# Patient Record
Sex: Female | Born: 1950 | Race: White | Hispanic: No | Marital: Married | Smoking: Never smoker
Health system: Southern US, Community
[De-identification: ages and names within clinical notes are randomized; demographics above are authoritative.]

## PROBLEM LIST (undated history)

## (undated) DIAGNOSIS — K219 Gastro-esophageal reflux disease without esophagitis: Secondary | ICD-10-CM

## (undated) DIAGNOSIS — E785 Hyperlipidemia, unspecified: Secondary | ICD-10-CM

## (undated) DIAGNOSIS — R7303 Prediabetes: Secondary | ICD-10-CM

## (undated) HISTORY — PX: COLONOSCOPY: SHX174

## (undated) HISTORY — DX: Prediabetes: R73.03

## (undated) HISTORY — DX: Hyperlipidemia, unspecified: E78.5

---

## 1978-11-27 HISTORY — PX: TUBAL LIGATION: SHX77

## 1998-04-16 ENCOUNTER — Other Ambulatory Visit: Admission: RE | Admit: 1998-04-16 | Discharge: 1998-04-16 | Payer: Self-pay | Admitting: Obstetrics and Gynecology

## 1999-12-30 ENCOUNTER — Other Ambulatory Visit: Admission: RE | Admit: 1999-12-30 | Discharge: 1999-12-30 | Payer: Self-pay | Admitting: Obstetrics and Gynecology

## 2000-03-06 ENCOUNTER — Ambulatory Visit (HOSPITAL_COMMUNITY): Admission: RE | Admit: 2000-03-06 | Discharge: 2000-03-06 | Payer: Self-pay | Admitting: Internal Medicine

## 2001-01-16 ENCOUNTER — Other Ambulatory Visit: Admission: RE | Admit: 2001-01-16 | Discharge: 2001-01-16 | Payer: Self-pay | Admitting: Obstetrics and Gynecology

## 2002-03-26 ENCOUNTER — Other Ambulatory Visit: Admission: RE | Admit: 2002-03-26 | Discharge: 2002-03-26 | Payer: Self-pay | Admitting: Obstetrics and Gynecology

## 2003-03-27 ENCOUNTER — Other Ambulatory Visit: Admission: RE | Admit: 2003-03-27 | Discharge: 2003-03-27 | Payer: Self-pay | Admitting: Obstetrics and Gynecology

## 2003-08-13 ENCOUNTER — Encounter: Admission: RE | Admit: 2003-08-13 | Discharge: 2003-08-13 | Payer: Self-pay | Admitting: Surgery

## 2003-08-13 ENCOUNTER — Encounter: Payer: Self-pay | Admitting: Surgery

## 2003-08-14 ENCOUNTER — Ambulatory Visit (HOSPITAL_BASED_OUTPATIENT_CLINIC_OR_DEPARTMENT_OTHER): Admission: RE | Admit: 2003-08-14 | Discharge: 2003-08-14 | Payer: Self-pay | Admitting: Surgery

## 2003-11-28 HISTORY — PX: HERNIA REPAIR: SHX51

## 2004-11-09 ENCOUNTER — Other Ambulatory Visit: Admission: RE | Admit: 2004-11-09 | Discharge: 2004-11-09 | Payer: Self-pay | Admitting: Obstetrics and Gynecology

## 2004-12-09 ENCOUNTER — Encounter: Admission: RE | Admit: 2004-12-09 | Discharge: 2004-12-09 | Payer: Self-pay | Admitting: Obstetrics and Gynecology

## 2006-01-25 ENCOUNTER — Other Ambulatory Visit: Admission: RE | Admit: 2006-01-25 | Discharge: 2006-01-25 | Payer: Self-pay | Admitting: Obstetrics and Gynecology

## 2012-05-02 ENCOUNTER — Encounter (INDEPENDENT_AMBULATORY_CARE_PROVIDER_SITE_OTHER): Payer: Self-pay | Admitting: General Surgery

## 2012-05-06 ENCOUNTER — Telehealth (INDEPENDENT_AMBULATORY_CARE_PROVIDER_SITE_OTHER): Payer: Self-pay

## 2012-05-06 NOTE — Telephone Encounter (Signed)
Notified the patient that I did not have an appt before 6/20, she understood and we discussed if her symptoms become more severe and she starts a fever, with nausea and vomiting she needs to go to the ED

## 2012-05-06 NOTE — Telephone Encounter (Signed)
The patient called regarding her appointment on 6/20.  She has gallstones and a constant ache.  She has no fever and she is not vomiting.  She would like to be seen this week if she can.  Please call her cell 1st [phone] and then her home #.

## 2012-05-16 ENCOUNTER — Ambulatory Visit (INDEPENDENT_AMBULATORY_CARE_PROVIDER_SITE_OTHER): Payer: BC Managed Care – PPO | Admitting: Surgery

## 2012-05-16 ENCOUNTER — Encounter (INDEPENDENT_AMBULATORY_CARE_PROVIDER_SITE_OTHER): Payer: Self-pay | Admitting: Surgery

## 2012-05-16 VITALS — BP 126/78 | HR 72 | Temp 98.0°F | Resp 18 | Ht 66.0 in | Wt 156.5 lb

## 2012-05-16 DIAGNOSIS — K8020 Calculus of gallbladder without cholecystitis without obstruction: Secondary | ICD-10-CM | POA: Insufficient documentation

## 2012-05-16 NOTE — Progress Notes (Signed)
Chief Complaint:  Abdominal and back pain and gallstones  History of Present Illness:  Catherine Stokes is an 61 y.o. female on M. I did aumbilical hernia several years ago his referred by Dr. Zella Ball gallstones. Ultrasound showed multiple gallstones the largest of which is 1.5cm in diameter. She's been having pain its worse after eating. She came to the office and I discussed laparoscopic cholecystectomy with her in some detail including complications not li bile leak bleedijury or common duct injury. She was to her and proceed with scheduling.  In the last several months she has hada pelvic exam by Dr. Tenny Craw, and a colonoscopy.  No past medical history on file.  No past surgical history on file.  Current Outpatient Prescriptions  Medication Sig Dispense Refill  . famotidine (PEPCID) 20 MG tablet Take 20 mg by mouth 2 (two) times daily.       Review of patient's allergies indicates no known allergies. Family History  Problem Relation Age of Onset  . Diabetes Mother   . Heart disease Mother    Social History:   reports that she has never smoked. She does not have any smokeless tobacco history on file. She reports that she does not drink alcohol or use illicit drugs.   REVIEW OF SYSTEMS - PERTINENT POSITIVES ONLY: noncontributory  Physical Exam:   Blood pressure 126/78, pulse 72, temperature 98 F (36.7 C), temperature source Temporal, resp. rate 18, height 5\' 6"  (1.676 m), weight 156 lb 8 oz (70.988 kg). Body mass index is 25.26 kg/(m^2).  Gen:  WDWN hite female NAD  Neurological: Alert and oriented to person, place, and time. Motor and sensory function is grossly intact  Head: Normocephalic and atraumatic.  Eyes: Conjunctivae are normal. Pupils are equal, round, and reactive to light. No scleral icterus.  Neck: Normal range of motion. Neck supple. No tracheal deviation or thyromegaly present.  Cardiovascular:  SR without murmurs or gallops.  No carotid  bruits Respiratory: Effort normal.  No respiratory distress. No chest wall tenderness. Breath sounds normal.  No wheezes, rales or rhonchi.  Abdomen:  nontender GU: Musculoskeletal: Normal range of motion. Extremities are nontender. No cyanosis, edema or clubbing noted Lymphadenopathy: No cervical, preauricular, postauricular or axillary adenopathy is present Skin: Skin is warm and dry. No rash noted. No diaphoresis. No erythema. No pallor. Pscyh: Normal mood and affect. Behavior is normal. Judgment and thought content normal.   LABORATORY RESULTS: No results found for this or any previous visit (from the past 48 hour(s)).  RADIOLOGY RESULTS: No results found.  Problem List: There is no problem list on file for this patient.   Assessment & Plan: Gallstones that are likely symptomatic.lan laparoscopic cholecystectomy with intraoperative cholangiogram.    Matt B. Daphine Deutscher, MD, [facility] [phone] beeper [phone]  05/16/2012 11:23 AM

## 2012-05-16 NOTE — Patient Instructions (Addendum)
Thanks for your patience.  If you need further assistance after leaving the office, please call our office and speak with Tracy A.  [phone].  If you want to leave a message for Dr. Dossie Ocanas, please call his office phone at [phone]. 

## 2012-05-22 ENCOUNTER — Encounter (INDEPENDENT_AMBULATORY_CARE_PROVIDER_SITE_OTHER): Payer: Self-pay

## 2012-07-12 ENCOUNTER — Encounter (HOSPITAL_COMMUNITY): Payer: Self-pay

## 2012-07-16 NOTE — Progress Notes (Signed)
Dr. Daphine Deutscher : Catherine Stokes  Coming for preop Wed 07/17/12 when you can, we need orders put in EPIC please. Thank you -

## 2012-07-17 ENCOUNTER — Encounter (HOSPITAL_COMMUNITY): Payer: Self-pay

## 2012-07-17 ENCOUNTER — Encounter (HOSPITAL_COMMUNITY)
Admission: RE | Admit: 2012-07-17 | Discharge: 2012-07-17 | Disposition: A | Discharge status: Home / Self Care | Payer: BC Managed Care – PPO | Source: Ambulatory Visit | Attending: Surgery | Admitting: Surgery

## 2012-07-17 ENCOUNTER — Other Ambulatory Visit (INDEPENDENT_AMBULATORY_CARE_PROVIDER_SITE_OTHER): Payer: Self-pay | Admitting: Surgery

## 2012-07-17 HISTORY — DX: Gastro-esophageal reflux disease without esophagitis: K21.9

## 2012-07-17 LAB — SURGICAL PCR SCREEN: Staphylococcus aureus: NEGATIVE

## 2012-07-17 LAB — CBC
HCT: 35.9 % — ABNORMAL LOW (ref 36.0–46.0)
Hemoglobin: 12.2 g/dL (ref 12.0–15.0)
MCHC: 34 g/dL (ref 30.0–36.0)
MCV: 88.6 fL (ref 78.0–100.0)
WBC: 9 10*3/uL (ref 4.0–10.5)

## 2012-07-17 NOTE — Patient Instructions (Addendum)
20 Catherine Stokes  07/17/2012   Your procedure is scheduled on:  Wednesday 07/24/2012 at 0830am  Report to [facility] at 0600 AM.  Call this number if you have problems the morning of surgery: [phone]   Remember:   Do not eat food:After Midnight.  May have clear liquids:until Midnight .  Marland Kitchen  Take these medicines the morning of surgery with A SIP OF WATER: Pepcid, Prilosec   Do not wear jewelry, make-up or nail polish.  Do not wear lotions, powders, or perfumes.   Do not shave 48 hours prior to surgery. Men may shave face and neck.  Do not bring valuables to the hospital.  Contacts, dentures or bridgework may not be worn into surgery.  Leave suitcase in the car. After surgery it may be brought to your room.  For patients admitted to the hospital, checkout time is 11:00 AM the day of discharge.      Special Instructions: CHG Shower Use Special Wash: 1/2 bottle night before surgery and 1/2 bottle morning of surgery.   Please read over the following fact sheets that you were given: MRSA Information, Incentive Spirometry, Sleep Apnea sheet                 If any questions, please call me at [phone] [name].[name], Charity fundraiser, BSN

## 2012-07-23 NOTE — H&P (Addendum)
Chief Complaint: Abdominal and back pain and gallstones  History of Present Illness: Catherine Stokes is an 61 y.o. female on M. I did aumbilical hernia several years ago his referred by Dr. Zella Ball gallstones. Ultrasound showed multiple gallstones the largest of which is 1.5cm in diameter. She's been having pain its worse after eating. She came to the office and I discussed laparoscopic cholecystectomy with her in some detail including complications not li bile leak bleedijury or common duct injury. She was to her and proceed with scheduling.  In the last several months she has hada pelvic exam by Dr. Tenny Craw, and a colonoscopy.  No past medical history on file.  No past surgical history on file.  Current Outpatient Prescriptions   Medication  Sig  Dispense  Refill   .  famotidine (PEPCID) 20 MG tablet  Take 20 mg by mouth 2 (two) times daily.      Review of patient's allergies indicates no known allergies.  Family History   Problem  Relation  Age of Onset   .  Diabetes  Mother    .  Heart disease  Mother     Social History: reports that she has never smoked. She does not have any smokeless tobacco history on file. She reports that she does not drink alcohol or use illicit drugs.  REVIEW OF SYSTEMS - PERTINENT POSITIVES ONLY:  noncontributory  Physical Exam:  Blood pressure 126/78, pulse 72, temperature 98 F (36.7 C), temperature source Temporal, resp. rate 18, height 5\' 6"  (1.676 m), weight 156 lb 8 oz (70.988 kg).  Body mass index is 25.26 kg/(m^2).  Gen: WDWN hite female NAD  Neurological: Alert and oriented to person, place, and time. Motor and sensory function is grossly intact  Head: Normocephalic and atraumatic.  Eyes: Conjunctivae are normal. Pupils are equal, round, and reactive to light. No scleral icterus.  Neck: Normal range of motion. Neck supple. No tracheal deviation or thyromegaly present.  Cardiovascular: SR without murmurs or gallops. No carotid bruits    Respiratory: Effort normal. No respiratory distress. No chest wall tenderness. Breath sounds normal. No wheezes, rales or rhonchi.  Abdomen: nontender  GU:  Musculoskeletal: Normal range of motion. Extremities are nontender. No cyanosis, edema or clubbing noted Lymphadenopathy: No cervical, preauricular, postauricular or axillary adenopathy is present Skin: Skin is warm and dry. No rash noted. No diaphoresis. No erythema. No pallor. Pscyh: Normal mood and affect. Behavior is normal. Judgment and thought content normal.  LABORATORY RESULTS:  No results found for this or any previous visit (from the past 48 hour(s)).  RADIOLOGY RESULTS:  No results found.  Problem List:  There is no problem list on file for this patient.   Assessment & Plan:  Gallstones that are likely symptomatic.lan laparoscopic cholecystectomy with intraoperative cholangiogram.  Matt B. Daphine Deutscher, MD, [facility]  [phone] beeper  [phone]  There has been no change in the patient's past medical history or physical exam in the past 24 hours to the best of my knowledge. I examined the patient in the holding area and have made any changes to the history and physical exam report that is included above.   Expectations and outcome results have been discussed with the patient to include risks and benefits.  All questions have been answered and we will proceed with previously discussed procedure noted and signed in the consent form in the patient's record.    Regina Coppolino BMD FACS 8:32 AM  07/24/2012

## 2012-07-24 ENCOUNTER — Encounter (HOSPITAL_COMMUNITY): Payer: Self-pay | Admitting: Anesthesiology

## 2012-07-24 ENCOUNTER — Ambulatory Visit (HOSPITAL_COMMUNITY): Payer: BC Managed Care – PPO

## 2012-07-24 ENCOUNTER — Ambulatory Visit (HOSPITAL_COMMUNITY)
Admission: RE | Admit: 2012-07-24 | Discharge: 2012-07-25 | Disposition: A | Discharge status: Home / Self Care | Payer: BC Managed Care – PPO | Source: Ambulatory Visit | Attending: Surgery | Admitting: Surgery

## 2012-07-24 ENCOUNTER — Encounter (HOSPITAL_COMMUNITY)
Admission: RE | Disposition: A | Discharge status: Home / Self Care | Payer: Self-pay | Source: Ambulatory Visit | Attending: Surgery

## 2012-07-24 ENCOUNTER — Encounter (HOSPITAL_COMMUNITY): Payer: Self-pay | Admitting: *Deleted

## 2012-07-24 ENCOUNTER — Ambulatory Visit (HOSPITAL_COMMUNITY): Payer: BC Managed Care – PPO | Admitting: Anesthesiology

## 2012-07-24 DIAGNOSIS — Z01812 Encounter for preprocedural laboratory examination: Secondary | ICD-10-CM | POA: Insufficient documentation

## 2012-07-24 DIAGNOSIS — K8010 Calculus of gallbladder with chronic cholecystitis without obstruction: Secondary | ICD-10-CM | POA: Insufficient documentation

## 2012-07-24 DIAGNOSIS — K8020 Calculus of gallbladder without cholecystitis without obstruction: Secondary | ICD-10-CM

## 2012-07-24 HISTORY — PX: CHOLECYSTECTOMY: SHX55

## 2012-07-24 LAB — CREATININE, SERUM: GFR calc non Af Amer: >90 mL/min (ref >90)

## 2012-07-24 LAB — CBC
HCT: 34 % — ABNORMAL LOW (ref 36.0–46.0)
Hemoglobin: 11.4 g/dL — ABNORMAL LOW (ref 12.0–15.0)
MCH: 29.7 pg (ref 26.0–34.0)
MCHC: 33.5 g/dL (ref 30.0–36.0)
RDW: 13.3 % (ref 11.5–15.5)

## 2012-07-24 SURGERY — LAPAROSCOPIC CHOLECYSTECTOMY WITH INTRAOPERATIVE CHOLANGIOGRAM
Anesthesia: General | Wound class: Clean Contaminated

## 2012-07-24 MED ORDER — SUFENTANIL CITRATE 50 MCG/ML IV SOLN
INTRAVENOUS | Status: DC | PRN
  Administered 2012-07-24 (×2): 10 ug via INTRAVENOUS
  Administered 2012-07-24: 20 ug via INTRAVENOUS

## 2012-07-24 MED ORDER — MIDAZOLAM HCL 5 MG/5ML IJ SOLN
INTRAMUSCULAR | Status: DC | PRN
  Administered 2012-07-24: 2 mg via INTRAVENOUS

## 2012-07-24 MED ORDER — ACETAMINOPHEN 10 MG/ML IV SOLN
1000.0000 mg | Freq: Four times a day (QID) | INTRAVENOUS | Status: AC
  Administered 2012-07-24 – 2012-07-25 (×4): 1000 mg via INTRAVENOUS
  Filled 2012-07-24 (×6): qty 100

## 2012-07-24 MED ORDER — CEFOXITIN SODIUM-DEXTROSE 1-4 GM-% IV SOLR (PREMIX)
INTRAVENOUS | Status: AC
  Filled 2012-07-24: qty 100

## 2012-07-24 MED ORDER — DEXAMETHASONE SODIUM PHOSPHATE 4 MG/ML IJ SOLN
INTRAMUSCULAR | Status: DC | PRN
  Administered 2012-07-24: 10 mg via INTRAVENOUS

## 2012-07-24 MED ORDER — MORPHINE SULFATE 2 MG/ML IJ SOLN
1.0000 mg | INTRAMUSCULAR | Status: DC | PRN
  Administered 2012-07-24 (×2): 1 mg via INTRAVENOUS
  Filled 2012-07-24 (×2): qty 1

## 2012-07-24 MED ORDER — HYDROMORPHONE HCL PF 1 MG/ML IJ SOLN: 0.2500 mg | INTRAMUSCULAR | Status: DC | PRN

## 2012-07-24 MED ORDER — HEPARIN SODIUM (PORCINE) 5000 UNIT/ML IJ SOLN
5000.0000 [IU] | Freq: Three times a day (TID) | INTRAMUSCULAR | Status: DC
  Administered 2012-07-24 – 2012-07-25 (×3): 5000 [IU] via SUBCUTANEOUS
  Filled 2012-07-24 (×6): qty 1

## 2012-07-24 MED ORDER — SUCCINYLCHOLINE CHLORIDE 20 MG/ML IJ SOLN
INTRAMUSCULAR | Status: DC | PRN
  Administered 2012-07-24: 80 mg via INTRAVENOUS

## 2012-07-24 MED ORDER — LACTATED RINGERS IV SOLN
INTRAVENOUS | Status: DC | PRN
  Administered 2012-07-24 (×2): via INTRAVENOUS

## 2012-07-24 MED ORDER — KCL IN DEXTROSE-NACL 20-5-0.45 MEQ/L-%-% IV SOLN
INTRAVENOUS | Status: DC
  Administered 2012-07-24: 100 mL via INTRAVENOUS
  Administered 2012-07-24: 12:00:00 via INTRAVENOUS
  Filled 2012-07-24 (×3): qty 1000

## 2012-07-24 MED ORDER — ACETAMINOPHEN 10 MG/ML IV SOLN
INTRAVENOUS | Status: AC
  Filled 2012-07-24: qty 100

## 2012-07-24 MED ORDER — BUPIVACAINE LIPOSOME 1.3 % IJ SUSP
20.0000 mL | INTRAMUSCULAR | Status: DC
  Filled 2012-07-24: qty 20

## 2012-07-24 MED ORDER — DEXTROSE 5 % IV SOLN
2.0000 g | INTRAVENOUS | Status: AC
  Administered 2012-07-24: 2 g via INTRAVENOUS
  Filled 2012-07-24: qty 2

## 2012-07-24 MED ORDER — PROMETHAZINE HCL 25 MG/ML IJ SOLN: 6.2500 mg | INTRAMUSCULAR | Status: DC | PRN

## 2012-07-24 MED ORDER — OXYCODONE-ACETAMINOPHEN 5-325 MG PO TABS: 1.0000 | ORAL_TABLET | ORAL | Status: DC | PRN

## 2012-07-24 MED ORDER — ONDANSETRON HCL 4 MG/2ML IJ SOLN: 4.0000 mg | Freq: Four times a day (QID) | INTRAMUSCULAR | Status: DC | PRN

## 2012-07-24 MED ORDER — ONDANSETRON HCL 4 MG PO TABS: 4.0000 mg | ORAL_TABLET | Freq: Four times a day (QID) | ORAL | Status: DC | PRN

## 2012-07-24 MED ORDER — IOHEXOL 300 MG/ML  SOLN
INTRAMUSCULAR | Status: AC
  Filled 2012-07-24: qty 1

## 2012-07-24 MED ORDER — ONDANSETRON HCL 4 MG/2ML IJ SOLN
INTRAMUSCULAR | Status: DC | PRN
  Administered 2012-07-24: 4 mg via INTRAVENOUS

## 2012-07-24 MED ORDER — PROPOFOL 10 MG/ML IV BOLUS
INTRAVENOUS | Status: DC | PRN
  Administered 2012-07-24: 160 mg via INTRAVENOUS

## 2012-07-24 MED ORDER — LIDOCAINE HCL (CARDIAC) 20 MG/ML IV SOLN
INTRAVENOUS | Status: DC | PRN
  Administered 2012-07-24: 100 mg via INTRAVENOUS

## 2012-07-24 MED ORDER — HEPARIN SODIUM (PORCINE) 5000 UNIT/ML IJ SOLN
5000.0000 [IU] | Freq: Once | INTRAMUSCULAR | Status: AC
  Administered 2012-07-24: 5000 [IU] via SUBCUTANEOUS
  Filled 2012-07-24: qty 1

## 2012-07-24 MED ORDER — NEOSTIGMINE METHYLSULFATE 1 MG/ML IJ SOLN
INTRAMUSCULAR | Status: DC | PRN
  Administered 2012-07-24: 4 mg via INTRAVENOUS

## 2012-07-24 MED ORDER — GLYCOPYRROLATE 0.2 MG/ML IJ SOLN
INTRAMUSCULAR | Status: DC | PRN
  Administered 2012-07-24: .6 mg via INTRAVENOUS

## 2012-07-24 MED ORDER — PANTOPRAZOLE SODIUM 40 MG PO TBEC
40.0000 mg | DELAYED_RELEASE_TABLET | Freq: Every day | ORAL | Status: DC
  Administered 2012-07-24: 40 mg via ORAL
  Filled 2012-07-24 (×2): qty 1

## 2012-07-24 MED ORDER — LACTATED RINGERS IV SOLN: INTRAVENOUS | Status: DC

## 2012-07-24 MED ORDER — BUPIVACAINE LIPOSOME 1.3 % IJ SUSP
INTRAMUSCULAR | Status: DC | PRN
  Administered 2012-07-24: 20 mL

## 2012-07-24 MED ORDER — SODIUM CHLORIDE 0.9 % IR SOLN
Status: DC | PRN
  Administered 2012-07-24: 1000 mL

## 2012-07-24 MED ORDER — CISATRACURIUM BESYLATE (PF) 10 MG/5ML IV SOLN
INTRAVENOUS | Status: DC | PRN
  Administered 2012-07-24: 5 mg via INTRAVENOUS

## 2012-07-24 MED ORDER — LACTATED RINGERS IR SOLN
Status: DC | PRN
  Administered 2012-07-24: 1000 mL

## 2012-07-24 MED ORDER — IOHEXOL 300 MG/ML  SOLN
INTRAMUSCULAR | Status: DC | PRN
  Administered 2012-07-24: 3 mL via ORAL

## 2012-07-24 MED ORDER — ACETAMINOPHEN 10 MG/ML IV SOLN
INTRAVENOUS | Status: DC | PRN
  Administered 2012-07-24: 1000 mg via INTRAVENOUS

## 2012-07-24 MED ORDER — BUPIVACAINE-EPINEPHRINE PF 0.25-1:200000 % IJ SOLN
INTRAMUSCULAR | Status: AC
  Filled 2012-07-24: qty 30

## 2012-07-24 SURGICAL SUPPLY — 41 items
APPLIER CLIP 5 13 M/L LIGAMAX5 (MISCELLANEOUS)
APPLIER CLIP ROT 10 11.4 M/L (STAPLE) ×2
BENZOIN TINCTURE PRP APPL 2/3 (GAUZE/BANDAGES/DRESSINGS) ×2 IMPLANT
CABLE HIGH FREQUENCY MONO STRZ (ELECTRODE) ×2 IMPLANT
CANISTER SUCTION 2500CC (MISCELLANEOUS) ×2 IMPLANT
CATH REDDICK CHOLANGI 4FR 50CM (CATHETERS) ×2 IMPLANT
CLIP APPLIE 5 13 M/L LIGAMAX5 (MISCELLANEOUS) IMPLANT
CLIP APPLIE ROT 10 11.4 M/L (STAPLE) ×1 IMPLANT
CLOTH BEACON ORANGE TIMEOUT ST (SAFETY) ×2 IMPLANT
COVER MAYO STAND STRL (DRAPES) ×2 IMPLANT
COVER SURGICAL LIGHT HANDLE (MISCELLANEOUS) ×2 IMPLANT
DECANTER SPIKE VIAL GLASS SM (MISCELLANEOUS) IMPLANT
DERMABOND ADVANCED (GAUZE/BANDAGES/DRESSINGS) ×1
DERMABOND ADVANCED .7 DNX12 (GAUZE/BANDAGES/DRESSINGS) ×1 IMPLANT
DRAPE C-ARM 42X72 X-RAY (DRAPES) ×2 IMPLANT
DRAPE LAPAROSCOPIC ABDOMINAL (DRAPES) ×2 IMPLANT
ELECT REM PT RETURN 9FT ADLT (ELECTROSURGICAL) ×2
ELECTRODE REM PT RTRN 9FT ADLT (ELECTROSURGICAL) ×1 IMPLANT
GLOVE BIOGEL M 8.0 STRL (GLOVE) ×2 IMPLANT
GOWN STRL NON-REIN LRG LVL3 (GOWN DISPOSABLE) ×2 IMPLANT
GOWN STRL REIN XL XLG (GOWN DISPOSABLE) ×4 IMPLANT
HEMOSTAT SURGICEL 4X8 (HEMOSTASIS) IMPLANT
IV CATH 14GX2 1/4 (CATHETERS) ×2 IMPLANT
KIT BASIN OR (CUSTOM PROCEDURE TRAY) ×2 IMPLANT
NS IRRIG 1000ML POUR BTL (IV SOLUTION) ×2 IMPLANT
POUCH SPECIMEN RETRIEVAL 10MM (ENDOMECHANICALS) ×2 IMPLANT
SCISSORS LAP 5X35 DISP (ENDOMECHANICALS) IMPLANT
SET IRRIG TUBING LAPAROSCOPIC (IRRIGATION / IRRIGATOR) ×2 IMPLANT
SLEEVE Z-THREAD 5X100MM (TROCAR) IMPLANT
SOLUTION ANTI FOG 6CC (MISCELLANEOUS) ×2 IMPLANT
STRIP CLOSURE SKIN 1/2X4 (GAUZE/BANDAGES/DRESSINGS) ×2 IMPLANT
SUT VIC AB 4-0 SH 18 (SUTURE) ×2 IMPLANT
SYR 30ML LL (SYRINGE) ×2 IMPLANT
TOWEL OR 17X26 10 PK STRL BLUE (TOWEL DISPOSABLE) ×4 IMPLANT
TRAY LAP CHOLE (CUSTOM PROCEDURE TRAY) ×2 IMPLANT
TROCAR BLADELESS OPT 5 75 (ENDOMECHANICALS) ×2 IMPLANT
TROCAR XCEL BLUNT TIP 100MML (ENDOMECHANICALS) ×2 IMPLANT
TROCAR XCEL NON-BLD 11X100MML (ENDOMECHANICALS) IMPLANT
TROCAR Z-THREAD FIOS 11X100 BL (TROCAR) ×2 IMPLANT
TROCAR Z-THREAD FIOS 5X100MM (TROCAR) ×6 IMPLANT
TUBING INSUFFLATION 10FT LAP (TUBING) ×2 IMPLANT

## 2012-07-24 NOTE — Anesthesia Postprocedure Evaluation (Signed)
  Anesthesia Post-op Note  Patient: Catherine Stokes  Procedure(s) Performed: Procedure(s) (LRB): LAPAROSCOPIC CHOLECYSTECTOMY WITH INTRAOPERATIVE CHOLANGIOGRAM (N/A)  Patient Location: PACU  Anesthesia Type: General  Level of Consciousness: awake and alert   Airway and Oxygen Therapy: Patient Spontanous Breathing  Post-op Pain: mild  Post-op Assessment: Post-op Vital signs reviewed, Patient's Cardiovascular Status Stable, Respiratory Function Stable, Patent Airway and No signs of Nausea or vomiting  Post-op Vital Signs: stable  Complications: No apparent anesthesia complications

## 2012-07-24 NOTE — Anesthesia Procedure Notes (Signed)
Procedure Name: Intubation Date/Time: 07/24/2012 8:42 AM Performed by: Leroy Libman L Patient Re-evaluated:Patient Re-evaluated prior to inductionOxygen Delivery Method: Circle system utilized Preoxygenation: Pre-oxygenation with 100% oxygen Intubation Type: IV induction Ventilation: Mask ventilation without difficulty and Oral airway inserted - appropriate to patient size Laryngoscope Size: Hyacinth Meeker and 2 Grade View: Grade I Tube type: Oral Tube size: 7.5 mm Number of attempts: 1 Airway Equipment and Method: Stylet Placement Confirmation: ETT inserted through vocal cords under direct vision,  breath sounds checked- equal and bilateral and positive ETCO2 Secured at: 22 cm Tube secured with: Tape Dental Injury: Teeth and Oropharynx as per pre-operative assessment

## 2012-07-24 NOTE — Transfer of Care (Signed)
Immediate Anesthesia Transfer of Care Note  Patient: Catherine Stokes  Procedure(s) Performed: Procedure(s) (LRB): LAPAROSCOPIC CHOLECYSTECTOMY WITH INTRAOPERATIVE CHOLANGIOGRAM (N/A)  Patient Location: PACU  Anesthesia Type: General  Level of Consciousness: sedated  Airway & Oxygen Therapy: Patient Spontanous Breathing and Patient connected to face mask oxygen  Post-op Assessment: Report given to PACU RN and Post -op Vital signs reviewed and stable  Post vital signs: Reviewed and stable  Complications: No apparent anesthesia complications

## 2012-07-24 NOTE — Op Note (Signed)
Catherine Stokes @date @  Procedure: Laparoscopic Cholecystectomy with intraoperative cholangiogram  Surgeon: Wenda Low, MD, FACS Asst:  Felicity Pellegrini, MD, FACS  Anes:  General  Drains: None  Findings: Chronic cholecystitis with normal IOC  Description of Procedure: The patient was taken to OR 11 and given general anesthesia.  The patient was prepped with PCMX and draped sterilely. A time out was performed.  Access to the abdomen was achieved with Optiview 5 mm through RUQ.  Port placement included 3 five mm and 1 12mm in upper midline.    The gallbladder was visualized and the fundus was grasped and the gallbladder was elevated. Traction on the infundibulum allowed for successful demonstration of the critical view. Inflammatory changes were chronic.  The cystic duct was identified and clipped up on the gallbladder and an incision was made in the cystic duct and the Reddick catheter was inserted after milking the cystic duct of any debris. A dynamic cholangiogram was performed which demonstrated good intrahepatic filling and free flow into the duodenum.    The cystic duct was then triple clipped and divided, the cystic artery was double clipped and divided and then the gallbladder was removed from the gallbladder bed. Removal of the gallbladder from the gallbladder bed was clean and no bleeding or bile leaks were noted.  The gallbladder was then placed in a bag and brought out through one of the 10 mm trocar sites. The gallbladder bed was inspected and no bleeding or bile leaks were seen.   Laparoscopic visualization was used when closing fascial defects for trocar sites.   Incisions were injected with Exparel and closed with 4-0 Vicryl and Dermabond on the skin.  Sponge and needle count were correct.    The patient was taken to the recovery room in satisfactory condition.

## 2012-07-24 NOTE — Preoperative (Signed)
Beta Blockers   Reason not to administer Beta Blockers:Not Applicable 

## 2012-07-24 NOTE — Anesthesia Preprocedure Evaluation (Signed)
Anesthesia Evaluation  Patient identified by MRN, date of birth, ID band Patient awake    Reviewed: Allergy & Precautions, H&P , NPO status , Patient's Chart, lab work & pertinent test results  Airway Mallampati: II TM Distance: >3 FB Neck ROM: Full    Dental No notable dental hx.    Pulmonary neg pulmonary ROS,  breath sounds clear to auscultation  Pulmonary exam normal       Cardiovascular negative cardio ROS  Rhythm:Regular Rate:Normal     Neuro/Psych negative neurological ROS  negative psych ROS   GI/Hepatic Neg liver ROS, GERD-  Medicated,  Endo/Other  negative endocrine ROS  Renal/GU negative Renal ROS  negative genitourinary   Musculoskeletal negative musculoskeletal ROS (+)   Abdominal   Peds negative pediatric ROS (+)  Hematology negative hematology ROS (+)   Anesthesia Other Findings   Reproductive/Obstetrics negative OB ROS                           Anesthesia Physical Anesthesia Plan  ASA: II  Anesthesia Plan: General   Post-op Pain Management:    Induction: Intravenous  Airway Management Planned: Oral ETT  Additional Equipment:   Intra-op Plan:   Post-operative Plan: Extubation in OR  Informed Consent: I have reviewed the patients History and Physical, chart, labs and discussed the procedure including the risks, benefits and alternatives for the proposed anesthesia with the patient or authorized representative who has indicated his/her understanding and acceptance.   Dental advisory given  Plan Discussed with: CRNA  Anesthesia Plan Comments:         Anesthesia Quick Evaluation  

## 2012-07-25 ENCOUNTER — Encounter (HOSPITAL_COMMUNITY): Payer: Self-pay | Admitting: Surgery

## 2012-07-25 LAB — COMPREHENSIVE METABOLIC PANEL
ALT: 29 U/L (ref 0–35)
BUN: 9 mg/dL (ref 6–23)
CO2: 26 mEq/L (ref 19–32)
Calcium: 9 mg/dL (ref 8.4–10.5)
Creatinine, Ser: 0.71 mg/dL (ref 0.50–1.10)
GFR calc Af Amer: >90 mL/min (ref >90)
GFR calc non Af Amer: >90 mL/min (ref >90)
Glucose, Bld: 137 mg/dL — ABNORMAL HIGH (ref 70–99)
Sodium: 138 mEq/L (ref 135–145)
Total Protein: 5.7 g/dL — ABNORMAL LOW (ref 6.0–8.3)

## 2012-07-25 LAB — CBC
Hemoglobin: 10.7 g/dL — ABNORMAL LOW (ref 12.0–15.0)
MCH: 30.3 pg (ref 26.0–34.0)
MCHC: 34 g/dL (ref 30.0–36.0)
MCV: 89.2 fL (ref 78.0–100.0)
RBC: 3.53 MIL/uL — ABNORMAL LOW (ref 3.87–5.11)

## 2012-07-25 MED ORDER — OXYCODONE-ACETAMINOPHEN 5-325 MG PO TABS: 1.0000 | ORAL_TABLET | ORAL | Status: AC | PRN

## 2012-07-25 NOTE — Discharge Summary (Signed)
Physician Discharge Summary  Patient ID: Catherine Stokes MRN: [id number] DOB/AGE: February 28, 1951 61 y.o.  Admit date: 07/24/2012 Discharge date: 07/25/2012  Admission Diagnoses:  gallstones  Discharge Diagnoses:  Chronic cholecystitis  Active Problems:  * No active hospital problems. *    Surgery:  Lap chole IOC  Discharged Condition: improved  Hospital Course:   Had surgery.  Kept overnight.  Exparel relieved incisional pain  Consults: none  Significant Diagnostic Studies: none    Discharge Exam: Blood pressure 106/65, pulse 63, temperature 98.1 F (36.7 C), temperature source Oral, resp. rate 16, height 5\' 6"  (1.676 m), weight 157 lb (71.215 kg), SpO2 95.00%. Minimal pain.   Disposition: Final discharge disposition not confirmed  Discharge Orders    Future Appointments: Provider: Department: Dept Phone: Center:   08/07/2012 3:10 PM Valarie Merino, MD [facility] [phone] None     Future Orders Please Complete By Expires   Diet - low sodium heart healthy      Increase activity slowly      Discharge instructions      Comments:   May shower and shampoo Use common sense with lifting.  Don't strain Don't drive if taking pain meds   No dressing needed        Medication List  As of 07/25/2012  8:48 AM   TAKE these medications         famotidine 20 MG tablet   Commonly known as: PEPCID   Take 20 mg by mouth 2 (two) times daily.      omeprazole 20 MG capsule   Commonly known as: PRILOSEC   Take 20 mg by mouth daily.      oxyCODONE-acetaminophen 5-325 MG per tablet   Commonly known as: PERCOCET/ROXICET   Take 1-2 tablets by mouth every 4 (four) hours as needed.           Follow-up Information    Follow up with Elianie Hubers B, MD in 4 weeks.   Contact information:   [address] [place] [address] [phone]          Signed: Valarie Merino 07/25/2012, 8:48 AM

## 2012-07-25 NOTE — Progress Notes (Signed)
Patient provided with discharge instructions and prescription. Patient verbalized understanding. Patient discharged to home. 

## 2012-08-07 ENCOUNTER — Encounter (INDEPENDENT_AMBULATORY_CARE_PROVIDER_SITE_OTHER): Payer: BC Managed Care – PPO | Admitting: Surgery

## 2012-08-08 ENCOUNTER — Telehealth (INDEPENDENT_AMBULATORY_CARE_PROVIDER_SITE_OTHER): Payer: Self-pay | Admitting: General Surgery

## 2012-08-08 NOTE — Telephone Encounter (Signed)
Spoke with pt and informed her that we can see her on a PRN basis and that she can call if she is having any problems.  She was fine with this.

## 2013-04-17 ENCOUNTER — Other Ambulatory Visit: Payer: Self-pay | Admitting: Obstetrics and Gynecology

## 2016-01-04 DIAGNOSIS — K319 Disease of stomach and duodenum, unspecified: Secondary | ICD-10-CM | POA: Diagnosis not present

## 2016-01-04 DIAGNOSIS — R7309 Other abnormal glucose: Secondary | ICD-10-CM | POA: Diagnosis not present

## 2016-01-04 DIAGNOSIS — M545 Low back pain: Secondary | ICD-10-CM | POA: Diagnosis not present

## 2016-01-04 DIAGNOSIS — E785 Hyperlipidemia, unspecified: Secondary | ICD-10-CM | POA: Diagnosis not present

## 2016-01-04 DIAGNOSIS — R7303 Prediabetes: Secondary | ICD-10-CM | POA: Diagnosis not present

## 2016-01-04 DIAGNOSIS — K5909 Other constipation: Secondary | ICD-10-CM | POA: Diagnosis not present

## 2016-01-04 DIAGNOSIS — Z6824 Body mass index (BMI) 24.0-24.9, adult: Secondary | ICD-10-CM | POA: Diagnosis not present

## 2016-01-20 DIAGNOSIS — H547 Unspecified visual loss: Secondary | ICD-10-CM | POA: Diagnosis not present

## 2016-01-20 DIAGNOSIS — H524 Presbyopia: Secondary | ICD-10-CM | POA: Diagnosis not present

## 2016-01-20 DIAGNOSIS — H2513 Age-related nuclear cataract, bilateral: Secondary | ICD-10-CM | POA: Diagnosis not present

## 2016-02-11 DIAGNOSIS — Z6824 Body mass index (BMI) 24.0-24.9, adult: Secondary | ICD-10-CM | POA: Diagnosis not present

## 2016-02-11 DIAGNOSIS — K529 Noninfective gastroenteritis and colitis, unspecified: Secondary | ICD-10-CM | POA: Diagnosis not present

## 2016-02-15 DIAGNOSIS — Z6824 Body mass index (BMI) 24.0-24.9, adult: Secondary | ICD-10-CM | POA: Diagnosis not present

## 2016-02-15 DIAGNOSIS — K529 Noninfective gastroenteritis and colitis, unspecified: Secondary | ICD-10-CM | POA: Diagnosis not present

## 2016-03-01 DIAGNOSIS — Z6823 Body mass index (BMI) 23.0-23.9, adult: Secondary | ICD-10-CM | POA: Diagnosis not present

## 2016-03-01 DIAGNOSIS — K529 Noninfective gastroenteritis and colitis, unspecified: Secondary | ICD-10-CM | POA: Diagnosis not present

## 2016-03-01 DIAGNOSIS — K589 Irritable bowel syndrome without diarrhea: Secondary | ICD-10-CM | POA: Diagnosis not present

## 2016-03-09 DIAGNOSIS — R197 Diarrhea, unspecified: Secondary | ICD-10-CM | POA: Diagnosis not present

## 2016-03-20 DIAGNOSIS — R197 Diarrhea, unspecified: Secondary | ICD-10-CM | POA: Diagnosis not present

## 2016-04-10 DIAGNOSIS — R197 Diarrhea, unspecified: Secondary | ICD-10-CM | POA: Diagnosis not present

## 2016-07-05 DIAGNOSIS — Z6823 Body mass index (BMI) 23.0-23.9, adult: Secondary | ICD-10-CM | POA: Diagnosis not present

## 2016-07-05 DIAGNOSIS — R7303 Prediabetes: Secondary | ICD-10-CM | POA: Diagnosis not present

## 2016-07-05 DIAGNOSIS — K589 Irritable bowel syndrome without diarrhea: Secondary | ICD-10-CM | POA: Diagnosis not present

## 2016-07-05 DIAGNOSIS — E785 Hyperlipidemia, unspecified: Secondary | ICD-10-CM | POA: Diagnosis not present

## 2016-07-05 DIAGNOSIS — K5900 Constipation, unspecified: Secondary | ICD-10-CM | POA: Diagnosis not present

## 2016-07-12 DIAGNOSIS — K319 Disease of stomach and duodenum, unspecified: Secondary | ICD-10-CM | POA: Diagnosis not present

## 2016-07-12 DIAGNOSIS — R7309 Other abnormal glucose: Secondary | ICD-10-CM | POA: Diagnosis not present

## 2016-07-12 DIAGNOSIS — E785 Hyperlipidemia, unspecified: Secondary | ICD-10-CM | POA: Diagnosis not present

## 2016-07-12 DIAGNOSIS — K219 Gastro-esophageal reflux disease without esophagitis: Secondary | ICD-10-CM | POA: Diagnosis not present

## 2016-07-17 ENCOUNTER — Other Ambulatory Visit: Payer: Self-pay | Admitting: Obstetrics and Gynecology

## 2016-07-17 DIAGNOSIS — Z1231 Encounter for screening mammogram for malignant neoplasm of breast: Secondary | ICD-10-CM | POA: Diagnosis not present

## 2016-07-17 DIAGNOSIS — Z124 Encounter for screening for malignant neoplasm of cervix: Secondary | ICD-10-CM | POA: Diagnosis not present

## 2016-07-17 DIAGNOSIS — Z01419 Encounter for gynecological examination (general) (routine) without abnormal findings: Secondary | ICD-10-CM | POA: Diagnosis not present

## 2016-07-20 LAB — CYTOLOGY - PAP

## 2016-08-15 DIAGNOSIS — M7581 Other shoulder lesions, right shoulder: Secondary | ICD-10-CM | POA: Diagnosis not present

## 2016-10-03 DIAGNOSIS — L570 Actinic keratosis: Secondary | ICD-10-CM | POA: Diagnosis not present

## 2017-01-02 DIAGNOSIS — E782 Mixed hyperlipidemia: Secondary | ICD-10-CM | POA: Diagnosis not present

## 2017-01-02 DIAGNOSIS — I10 Essential (primary) hypertension: Secondary | ICD-10-CM | POA: Diagnosis not present

## 2017-01-02 DIAGNOSIS — R7303 Prediabetes: Secondary | ICD-10-CM | POA: Diagnosis not present

## 2017-01-02 DIAGNOSIS — F5101 Primary insomnia: Secondary | ICD-10-CM | POA: Diagnosis not present

## 2017-01-25 DIAGNOSIS — H2513 Age-related nuclear cataract, bilateral: Secondary | ICD-10-CM | POA: Diagnosis not present

## 2017-01-25 DIAGNOSIS — H524 Presbyopia: Secondary | ICD-10-CM | POA: Diagnosis not present

## 2017-02-12 DIAGNOSIS — Z460 Encounter for fitting and adjustment of spectacles and contact lenses: Secondary | ICD-10-CM | POA: Diagnosis not present

## 2017-03-20 DIAGNOSIS — J029 Acute pharyngitis, unspecified: Secondary | ICD-10-CM | POA: Diagnosis not present

## 2017-04-30 DIAGNOSIS — K210 Gastro-esophageal reflux disease with esophagitis: Secondary | ICD-10-CM | POA: Diagnosis not present

## 2017-04-30 DIAGNOSIS — M8589 Other specified disorders of bone density and structure, multiple sites: Secondary | ICD-10-CM | POA: Diagnosis not present

## 2017-04-30 DIAGNOSIS — R7303 Prediabetes: Secondary | ICD-10-CM | POA: Diagnosis not present

## 2017-04-30 DIAGNOSIS — E782 Mixed hyperlipidemia: Secondary | ICD-10-CM | POA: Diagnosis not present

## 2017-08-09 DIAGNOSIS — Z01419 Encounter for gynecological examination (general) (routine) without abnormal findings: Secondary | ICD-10-CM | POA: Diagnosis not present

## 2017-08-09 DIAGNOSIS — Z1231 Encounter for screening mammogram for malignant neoplasm of breast: Secondary | ICD-10-CM | POA: Diagnosis not present

## 2017-08-30 DIAGNOSIS — Z23 Encounter for immunization: Secondary | ICD-10-CM | POA: Diagnosis not present

## 2017-08-30 DIAGNOSIS — K210 Gastro-esophageal reflux disease with esophagitis: Secondary | ICD-10-CM | POA: Diagnosis not present

## 2017-08-30 DIAGNOSIS — E782 Mixed hyperlipidemia: Secondary | ICD-10-CM | POA: Diagnosis not present

## 2017-08-30 DIAGNOSIS — M8589 Other specified disorders of bone density and structure, multiple sites: Secondary | ICD-10-CM | POA: Diagnosis not present

## 2017-08-30 DIAGNOSIS — R7303 Prediabetes: Secondary | ICD-10-CM | POA: Diagnosis not present

## 2017-12-19 DIAGNOSIS — J040 Acute laryngitis: Secondary | ICD-10-CM | POA: Diagnosis not present

## 2017-12-19 DIAGNOSIS — M25511 Pain in right shoulder: Secondary | ICD-10-CM | POA: Diagnosis not present

## 2017-12-21 DIAGNOSIS — M75121 Complete rotator cuff tear or rupture of right shoulder, not specified as traumatic: Secondary | ICD-10-CM | POA: Diagnosis not present

## 2017-12-21 DIAGNOSIS — M25511 Pain in right shoulder: Secondary | ICD-10-CM | POA: Diagnosis not present

## 2017-12-21 DIAGNOSIS — M19011 Primary osteoarthritis, right shoulder: Secondary | ICD-10-CM | POA: Diagnosis not present

## 2018-01-04 DIAGNOSIS — R7303 Prediabetes: Secondary | ICD-10-CM | POA: Diagnosis not present

## 2018-01-04 DIAGNOSIS — E782 Mixed hyperlipidemia: Secondary | ICD-10-CM | POA: Diagnosis not present

## 2018-01-04 DIAGNOSIS — M8589 Other specified disorders of bone density and structure, multiple sites: Secondary | ICD-10-CM | POA: Diagnosis not present

## 2018-01-04 DIAGNOSIS — K210 Gastro-esophageal reflux disease with esophagitis: Secondary | ICD-10-CM | POA: Diagnosis not present

## 2018-01-09 DIAGNOSIS — M25511 Pain in right shoulder: Secondary | ICD-10-CM | POA: Diagnosis not present

## 2018-01-28 DIAGNOSIS — H2513 Age-related nuclear cataract, bilateral: Secondary | ICD-10-CM | POA: Diagnosis not present

## 2018-01-29 DIAGNOSIS — N3000 Acute cystitis without hematuria: Secondary | ICD-10-CM | POA: Diagnosis not present

## 2018-04-29 DIAGNOSIS — R7303 Prediabetes: Secondary | ICD-10-CM | POA: Diagnosis not present

## 2018-04-29 DIAGNOSIS — E782 Mixed hyperlipidemia: Secondary | ICD-10-CM | POA: Diagnosis not present

## 2018-04-29 DIAGNOSIS — K210 Gastro-esophageal reflux disease with esophagitis: Secondary | ICD-10-CM | POA: Diagnosis not present

## 2018-04-29 DIAGNOSIS — M8589 Other specified disorders of bone density and structure, multiple sites: Secondary | ICD-10-CM | POA: Diagnosis not present

## 2018-07-15 DIAGNOSIS — M85851 Other specified disorders of bone density and structure, right thigh: Secondary | ICD-10-CM | POA: Diagnosis not present

## 2018-07-15 DIAGNOSIS — N959 Unspecified menopausal and perimenopausal disorder: Secondary | ICD-10-CM | POA: Diagnosis not present

## 2018-08-13 DIAGNOSIS — E119 Type 2 diabetes mellitus without complications: Secondary | ICD-10-CM | POA: Insufficient documentation

## 2018-08-13 DIAGNOSIS — M79672 Pain in left foot: Secondary | ICD-10-CM | POA: Insufficient documentation

## 2018-08-13 DIAGNOSIS — M7989 Other specified soft tissue disorders: Secondary | ICD-10-CM | POA: Insufficient documentation

## 2018-08-30 DIAGNOSIS — E782 Mixed hyperlipidemia: Secondary | ICD-10-CM | POA: Diagnosis not present

## 2018-08-30 DIAGNOSIS — Z6823 Body mass index (BMI) 23.0-23.9, adult: Secondary | ICD-10-CM | POA: Diagnosis not present

## 2018-08-30 DIAGNOSIS — K210 Gastro-esophageal reflux disease with esophagitis: Secondary | ICD-10-CM | POA: Diagnosis not present

## 2018-08-30 DIAGNOSIS — R7303 Prediabetes: Secondary | ICD-10-CM | POA: Diagnosis not present

## 2018-08-30 DIAGNOSIS — Z23 Encounter for immunization: Secondary | ICD-10-CM | POA: Diagnosis not present

## 2018-09-02 DIAGNOSIS — Z01419 Encounter for gynecological examination (general) (routine) without abnormal findings: Secondary | ICD-10-CM | POA: Diagnosis not present

## 2018-09-02 DIAGNOSIS — Z1231 Encounter for screening mammogram for malignant neoplasm of breast: Secondary | ICD-10-CM | POA: Diagnosis not present

## 2018-12-19 DIAGNOSIS — L570 Actinic keratosis: Secondary | ICD-10-CM | POA: Diagnosis not present

## 2018-12-30 DIAGNOSIS — R7303 Prediabetes: Secondary | ICD-10-CM | POA: Diagnosis not present

## 2018-12-30 DIAGNOSIS — F5102 Adjustment insomnia: Secondary | ICD-10-CM | POA: Diagnosis not present

## 2018-12-30 DIAGNOSIS — E782 Mixed hyperlipidemia: Secondary | ICD-10-CM | POA: Diagnosis not present

## 2018-12-30 DIAGNOSIS — Z6823 Body mass index (BMI) 23.0-23.9, adult: Secondary | ICD-10-CM | POA: Diagnosis not present

## 2019-02-06 ENCOUNTER — Encounter: Payer: PPO | Admitting: Diagnostic Neuroimaging

## 2019-03-03 DIAGNOSIS — R000 Tachycardia, unspecified: Secondary | ICD-10-CM | POA: Diagnosis not present

## 2019-03-03 DIAGNOSIS — I959 Hypotension, unspecified: Secondary | ICD-10-CM | POA: Diagnosis not present

## 2019-03-03 DIAGNOSIS — J0100 Acute maxillary sinusitis, unspecified: Secondary | ICD-10-CM | POA: Diagnosis not present

## 2019-03-03 DIAGNOSIS — N182 Chronic kidney disease, stage 2 (mild): Secondary | ICD-10-CM | POA: Diagnosis not present

## 2019-03-03 DIAGNOSIS — E1122 Type 2 diabetes mellitus with diabetic chronic kidney disease: Secondary | ICD-10-CM | POA: Diagnosis not present

## 2019-03-03 DIAGNOSIS — I10 Essential (primary) hypertension: Secondary | ICD-10-CM | POA: Diagnosis not present

## 2019-03-03 DIAGNOSIS — R944 Abnormal results of kidney function studies: Secondary | ICD-10-CM | POA: Diagnosis not present

## 2019-03-03 DIAGNOSIS — E782 Mixed hyperlipidemia: Secondary | ICD-10-CM | POA: Diagnosis not present

## 2019-04-28 DIAGNOSIS — Z1159 Encounter for screening for other viral diseases: Secondary | ICD-10-CM | POA: Diagnosis not present

## 2019-04-28 DIAGNOSIS — K210 Gastro-esophageal reflux disease with esophagitis: Secondary | ICD-10-CM | POA: Diagnosis not present

## 2019-04-28 DIAGNOSIS — R7303 Prediabetes: Secondary | ICD-10-CM | POA: Diagnosis not present

## 2019-04-28 DIAGNOSIS — F5102 Adjustment insomnia: Secondary | ICD-10-CM | POA: Diagnosis not present

## 2019-04-28 DIAGNOSIS — E782 Mixed hyperlipidemia: Secondary | ICD-10-CM | POA: Diagnosis not present

## 2019-04-28 DIAGNOSIS — Z6823 Body mass index (BMI) 23.0-23.9, adult: Secondary | ICD-10-CM | POA: Diagnosis not present

## 2019-04-30 DIAGNOSIS — H524 Presbyopia: Secondary | ICD-10-CM | POA: Diagnosis not present

## 2019-04-30 DIAGNOSIS — E119 Type 2 diabetes mellitus without complications: Secondary | ICD-10-CM | POA: Diagnosis not present

## 2019-04-30 DIAGNOSIS — H25813 Combined forms of age-related cataract, bilateral: Secondary | ICD-10-CM | POA: Diagnosis not present

## 2019-06-12 DIAGNOSIS — D485 Neoplasm of uncertain behavior of skin: Secondary | ICD-10-CM | POA: Diagnosis not present

## 2019-08-27 DIAGNOSIS — S46011A Strain of muscle(s) and tendon(s) of the rotator cuff of right shoulder, initial encounter: Secondary | ICD-10-CM | POA: Diagnosis not present

## 2019-08-28 DIAGNOSIS — K2100 Gastro-esophageal reflux disease with esophagitis, without bleeding: Secondary | ICD-10-CM | POA: Diagnosis not present

## 2019-08-28 DIAGNOSIS — Z23 Encounter for immunization: Secondary | ICD-10-CM | POA: Diagnosis not present

## 2019-08-28 DIAGNOSIS — Z6823 Body mass index (BMI) 23.0-23.9, adult: Secondary | ICD-10-CM | POA: Diagnosis not present

## 2019-08-28 DIAGNOSIS — R7303 Prediabetes: Secondary | ICD-10-CM | POA: Diagnosis not present

## 2019-08-28 DIAGNOSIS — E782 Mixed hyperlipidemia: Secondary | ICD-10-CM | POA: Diagnosis not present

## 2019-08-28 DIAGNOSIS — Z1159 Encounter for screening for other viral diseases: Secondary | ICD-10-CM | POA: Diagnosis not present

## 2019-09-26 DIAGNOSIS — Z1231 Encounter for screening mammogram for malignant neoplasm of breast: Secondary | ICD-10-CM | POA: Diagnosis not present

## 2019-09-30 ENCOUNTER — Other Ambulatory Visit: Payer: Self-pay | Admitting: Obstetrics and Gynecology

## 2019-09-30 DIAGNOSIS — R928 Other abnormal and inconclusive findings on diagnostic imaging of breast: Secondary | ICD-10-CM

## 2019-10-03 ENCOUNTER — Ambulatory Visit
Admission: RE | Admit: 2019-10-03 | Discharge: 2019-10-03 | Disposition: A | Discharge status: Home / Self Care | Payer: PPO | Source: Ambulatory Visit | Attending: Obstetrics and Gynecology | Admitting: Obstetrics and Gynecology

## 2019-10-03 ENCOUNTER — Other Ambulatory Visit: Payer: Self-pay

## 2019-10-03 ENCOUNTER — Other Ambulatory Visit: Payer: Self-pay | Admitting: Obstetrics and Gynecology

## 2019-10-03 DIAGNOSIS — N6321 Unspecified lump in the left breast, upper outer quadrant: Secondary | ICD-10-CM | POA: Diagnosis not present

## 2019-10-03 DIAGNOSIS — N6320 Unspecified lump in the left breast, unspecified quadrant: Secondary | ICD-10-CM

## 2019-10-03 DIAGNOSIS — R928 Other abnormal and inconclusive findings on diagnostic imaging of breast: Secondary | ICD-10-CM

## 2019-10-03 DIAGNOSIS — R922 Inconclusive mammogram: Secondary | ICD-10-CM | POA: Diagnosis not present

## 2019-10-06 ENCOUNTER — Ambulatory Visit
Admission: RE | Admit: 2019-10-06 | Discharge: 2019-10-06 | Disposition: A | Discharge status: Home / Self Care | Payer: PPO | Source: Ambulatory Visit | Attending: Obstetrics and Gynecology | Admitting: Obstetrics and Gynecology

## 2019-10-06 ENCOUNTER — Other Ambulatory Visit: Payer: Self-pay

## 2019-10-06 ENCOUNTER — Other Ambulatory Visit: Payer: Self-pay | Admitting: Obstetrics and Gynecology

## 2019-10-06 DIAGNOSIS — N6320 Unspecified lump in the left breast, unspecified quadrant: Secondary | ICD-10-CM

## 2019-10-06 DIAGNOSIS — N6321 Unspecified lump in the left breast, upper outer quadrant: Secondary | ICD-10-CM | POA: Diagnosis not present

## 2019-10-06 DIAGNOSIS — D242 Benign neoplasm of left breast: Secondary | ICD-10-CM | POA: Diagnosis not present

## 2019-10-07 ENCOUNTER — Other Ambulatory Visit: Payer: PPO

## 2019-10-09 DIAGNOSIS — M19011 Primary osteoarthritis, right shoulder: Secondary | ICD-10-CM | POA: Diagnosis not present

## 2019-10-09 DIAGNOSIS — M7521 Bicipital tendinitis, right shoulder: Secondary | ICD-10-CM | POA: Diagnosis not present

## 2019-10-09 DIAGNOSIS — S46011A Strain of muscle(s) and tendon(s) of the rotator cuff of right shoulder, initial encounter: Secondary | ICD-10-CM | POA: Diagnosis not present

## 2019-10-09 DIAGNOSIS — G8918 Other acute postprocedural pain: Secondary | ICD-10-CM | POA: Diagnosis not present

## 2019-10-09 DIAGNOSIS — S83232A Complex tear of medial meniscus, current injury, left knee, initial encounter: Secondary | ICD-10-CM | POA: Diagnosis not present

## 2019-10-09 DIAGNOSIS — M75121 Complete rotator cuff tear or rupture of right shoulder, not specified as traumatic: Secondary | ICD-10-CM | POA: Diagnosis not present

## 2019-10-09 DIAGNOSIS — M7541 Impingement syndrome of right shoulder: Secondary | ICD-10-CM | POA: Diagnosis not present

## 2019-10-09 DIAGNOSIS — M24111 Other articular cartilage disorders, right shoulder: Secondary | ICD-10-CM | POA: Diagnosis not present

## 2019-10-09 DIAGNOSIS — M7551 Bursitis of right shoulder: Secondary | ICD-10-CM | POA: Diagnosis not present

## 2019-10-20 DIAGNOSIS — M19011 Primary osteoarthritis, right shoulder: Secondary | ICD-10-CM | POA: Diagnosis not present

## 2019-10-21 DIAGNOSIS — M25511 Pain in right shoulder: Secondary | ICD-10-CM | POA: Diagnosis not present

## 2019-10-21 DIAGNOSIS — M25611 Stiffness of right shoulder, not elsewhere classified: Secondary | ICD-10-CM | POA: Diagnosis not present

## 2019-10-21 DIAGNOSIS — M6281 Muscle weakness (generalized): Secondary | ICD-10-CM | POA: Diagnosis not present

## 2019-10-27 DIAGNOSIS — M25511 Pain in right shoulder: Secondary | ICD-10-CM | POA: Diagnosis not present

## 2019-10-27 DIAGNOSIS — M6281 Muscle weakness (generalized): Secondary | ICD-10-CM | POA: Diagnosis not present

## 2019-10-27 DIAGNOSIS — M25611 Stiffness of right shoulder, not elsewhere classified: Secondary | ICD-10-CM | POA: Diagnosis not present

## 2019-10-29 DIAGNOSIS — M25511 Pain in right shoulder: Secondary | ICD-10-CM | POA: Diagnosis not present

## 2019-10-29 DIAGNOSIS — M25611 Stiffness of right shoulder, not elsewhere classified: Secondary | ICD-10-CM | POA: Diagnosis not present

## 2019-10-29 DIAGNOSIS — M6281 Muscle weakness (generalized): Secondary | ICD-10-CM | POA: Diagnosis not present

## 2019-11-03 DIAGNOSIS — M6281 Muscle weakness (generalized): Secondary | ICD-10-CM | POA: Diagnosis not present

## 2019-11-03 DIAGNOSIS — M25511 Pain in right shoulder: Secondary | ICD-10-CM | POA: Diagnosis not present

## 2019-11-03 DIAGNOSIS — M25611 Stiffness of right shoulder, not elsewhere classified: Secondary | ICD-10-CM | POA: Diagnosis not present

## 2019-11-06 DIAGNOSIS — M25611 Stiffness of right shoulder, not elsewhere classified: Secondary | ICD-10-CM | POA: Diagnosis not present

## 2019-11-06 DIAGNOSIS — M6281 Muscle weakness (generalized): Secondary | ICD-10-CM | POA: Diagnosis not present

## 2019-11-06 DIAGNOSIS — M25511 Pain in right shoulder: Secondary | ICD-10-CM | POA: Diagnosis not present

## 2019-11-10 DIAGNOSIS — M25511 Pain in right shoulder: Secondary | ICD-10-CM | POA: Diagnosis not present

## 2019-11-10 DIAGNOSIS — M6281 Muscle weakness (generalized): Secondary | ICD-10-CM | POA: Diagnosis not present

## 2019-11-10 DIAGNOSIS — M25611 Stiffness of right shoulder, not elsewhere classified: Secondary | ICD-10-CM | POA: Diagnosis not present

## 2019-11-13 DIAGNOSIS — M6281 Muscle weakness (generalized): Secondary | ICD-10-CM | POA: Diagnosis not present

## 2019-11-13 DIAGNOSIS — M25511 Pain in right shoulder: Secondary | ICD-10-CM | POA: Diagnosis not present

## 2019-11-13 DIAGNOSIS — M25611 Stiffness of right shoulder, not elsewhere classified: Secondary | ICD-10-CM | POA: Diagnosis not present

## 2019-11-17 DIAGNOSIS — M19011 Primary osteoarthritis, right shoulder: Secondary | ICD-10-CM | POA: Diagnosis not present

## 2019-11-18 DIAGNOSIS — M6281 Muscle weakness (generalized): Secondary | ICD-10-CM | POA: Diagnosis not present

## 2019-11-18 DIAGNOSIS — M25611 Stiffness of right shoulder, not elsewhere classified: Secondary | ICD-10-CM | POA: Diagnosis not present

## 2019-11-18 DIAGNOSIS — M25511 Pain in right shoulder: Secondary | ICD-10-CM | POA: Diagnosis not present

## 2019-11-19 DIAGNOSIS — M6281 Muscle weakness (generalized): Secondary | ICD-10-CM | POA: Diagnosis not present

## 2019-11-19 DIAGNOSIS — M25611 Stiffness of right shoulder, not elsewhere classified: Secondary | ICD-10-CM | POA: Diagnosis not present

## 2019-11-19 DIAGNOSIS — M25511 Pain in right shoulder: Secondary | ICD-10-CM | POA: Diagnosis not present

## 2019-11-25 DIAGNOSIS — M25511 Pain in right shoulder: Secondary | ICD-10-CM | POA: Diagnosis not present

## 2019-11-25 DIAGNOSIS — M6281 Muscle weakness (generalized): Secondary | ICD-10-CM | POA: Diagnosis not present

## 2019-11-25 DIAGNOSIS — M25611 Stiffness of right shoulder, not elsewhere classified: Secondary | ICD-10-CM | POA: Diagnosis not present

## 2019-11-27 DIAGNOSIS — M25511 Pain in right shoulder: Secondary | ICD-10-CM | POA: Diagnosis not present

## 2019-11-27 DIAGNOSIS — M6281 Muscle weakness (generalized): Secondary | ICD-10-CM | POA: Diagnosis not present

## 2019-11-27 DIAGNOSIS — M25611 Stiffness of right shoulder, not elsewhere classified: Secondary | ICD-10-CM | POA: Diagnosis not present

## 2019-12-01 DIAGNOSIS — M25611 Stiffness of right shoulder, not elsewhere classified: Secondary | ICD-10-CM | POA: Diagnosis not present

## 2019-12-01 DIAGNOSIS — M6281 Muscle weakness (generalized): Secondary | ICD-10-CM | POA: Diagnosis not present

## 2019-12-01 DIAGNOSIS — M25511 Pain in right shoulder: Secondary | ICD-10-CM | POA: Diagnosis not present

## 2019-12-08 DIAGNOSIS — Z1159 Encounter for screening for other viral diseases: Secondary | ICD-10-CM | POA: Diagnosis not present

## 2019-12-08 DIAGNOSIS — M6281 Muscle weakness (generalized): Secondary | ICD-10-CM | POA: Diagnosis not present

## 2019-12-08 DIAGNOSIS — R1084 Generalized abdominal pain: Secondary | ICD-10-CM | POA: Diagnosis not present

## 2019-12-08 DIAGNOSIS — M25511 Pain in right shoulder: Secondary | ICD-10-CM | POA: Diagnosis not present

## 2019-12-08 DIAGNOSIS — R197 Diarrhea, unspecified: Secondary | ICD-10-CM | POA: Diagnosis not present

## 2019-12-08 DIAGNOSIS — M25611 Stiffness of right shoulder, not elsewhere classified: Secondary | ICD-10-CM | POA: Diagnosis not present

## 2019-12-11 DIAGNOSIS — M25511 Pain in right shoulder: Secondary | ICD-10-CM | POA: Diagnosis not present

## 2019-12-11 DIAGNOSIS — M6281 Muscle weakness (generalized): Secondary | ICD-10-CM | POA: Diagnosis not present

## 2019-12-11 DIAGNOSIS — M25611 Stiffness of right shoulder, not elsewhere classified: Secondary | ICD-10-CM | POA: Diagnosis not present

## 2019-12-16 DIAGNOSIS — M6281 Muscle weakness (generalized): Secondary | ICD-10-CM | POA: Diagnosis not present

## 2019-12-16 DIAGNOSIS — M25511 Pain in right shoulder: Secondary | ICD-10-CM | POA: Diagnosis not present

## 2019-12-16 DIAGNOSIS — M25611 Stiffness of right shoulder, not elsewhere classified: Secondary | ICD-10-CM | POA: Diagnosis not present

## 2019-12-18 DIAGNOSIS — M25511 Pain in right shoulder: Secondary | ICD-10-CM | POA: Diagnosis not present

## 2019-12-18 DIAGNOSIS — M25611 Stiffness of right shoulder, not elsewhere classified: Secondary | ICD-10-CM | POA: Diagnosis not present

## 2019-12-18 DIAGNOSIS — M6281 Muscle weakness (generalized): Secondary | ICD-10-CM | POA: Diagnosis not present

## 2019-12-22 DIAGNOSIS — M19011 Primary osteoarthritis, right shoulder: Secondary | ICD-10-CM | POA: Diagnosis not present

## 2019-12-24 DIAGNOSIS — M25611 Stiffness of right shoulder, not elsewhere classified: Secondary | ICD-10-CM | POA: Diagnosis not present

## 2019-12-24 DIAGNOSIS — M6281 Muscle weakness (generalized): Secondary | ICD-10-CM | POA: Diagnosis not present

## 2019-12-24 DIAGNOSIS — M25511 Pain in right shoulder: Secondary | ICD-10-CM | POA: Diagnosis not present

## 2019-12-26 DIAGNOSIS — M25611 Stiffness of right shoulder, not elsewhere classified: Secondary | ICD-10-CM | POA: Diagnosis not present

## 2019-12-26 DIAGNOSIS — M25511 Pain in right shoulder: Secondary | ICD-10-CM | POA: Diagnosis not present

## 2019-12-26 DIAGNOSIS — M6281 Muscle weakness (generalized): Secondary | ICD-10-CM | POA: Diagnosis not present

## 2019-12-29 DIAGNOSIS — M25511 Pain in right shoulder: Secondary | ICD-10-CM | POA: Diagnosis not present

## 2019-12-29 DIAGNOSIS — M25611 Stiffness of right shoulder, not elsewhere classified: Secondary | ICD-10-CM | POA: Diagnosis not present

## 2019-12-29 DIAGNOSIS — M8580 Other specified disorders of bone density and structure, unspecified site: Secondary | ICD-10-CM | POA: Insufficient documentation

## 2019-12-29 DIAGNOSIS — M6281 Muscle weakness (generalized): Secondary | ICD-10-CM | POA: Diagnosis not present

## 2019-12-29 DIAGNOSIS — K2100 Gastro-esophageal reflux disease with esophagitis, without bleeding: Secondary | ICD-10-CM | POA: Insufficient documentation

## 2019-12-29 DIAGNOSIS — E782 Mixed hyperlipidemia: Secondary | ICD-10-CM | POA: Insufficient documentation

## 2019-12-29 DIAGNOSIS — F5102 Adjustment insomnia: Secondary | ICD-10-CM | POA: Insufficient documentation

## 2019-12-29 DIAGNOSIS — R7303 Prediabetes: Secondary | ICD-10-CM | POA: Insufficient documentation

## 2019-12-30 ENCOUNTER — Other Ambulatory Visit: Payer: Self-pay

## 2019-12-30 ENCOUNTER — Ambulatory Visit (INDEPENDENT_AMBULATORY_CARE_PROVIDER_SITE_OTHER): Payer: PPO | Admitting: Legal Medicine

## 2019-12-30 ENCOUNTER — Encounter: Payer: Self-pay | Admitting: Legal Medicine

## 2019-12-30 DIAGNOSIS — K2100 Gastro-esophageal reflux disease with esophagitis, without bleeding: Secondary | ICD-10-CM

## 2019-12-30 DIAGNOSIS — M8588 Other specified disorders of bone density and structure, other site: Secondary | ICD-10-CM

## 2019-12-30 DIAGNOSIS — E782 Mixed hyperlipidemia: Secondary | ICD-10-CM | POA: Diagnosis not present

## 2019-12-30 DIAGNOSIS — M25511 Pain in right shoulder: Secondary | ICD-10-CM | POA: Diagnosis not present

## 2019-12-30 DIAGNOSIS — R7303 Prediabetes: Secondary | ICD-10-CM

## 2019-12-30 DIAGNOSIS — F5102 Adjustment insomnia: Secondary | ICD-10-CM | POA: Diagnosis not present

## 2019-12-30 MED ORDER — PRAVASTATIN SODIUM 40 MG PO TABS: 40.0000 mg | ORAL_TABLET | Freq: Every day | ORAL | 2 refills | Status: DC

## 2019-12-30 NOTE — Assessment & Plan Note (Signed)
AN INDIVIDUAL CARE PLAN was established and reinforced today.  The patient's status was assessed using clinical findings on exam, lab and other diagnostic tests. The patient's disease status was assessed based on evidence-based guidelines and found to be good controlled. MEDICATIONS were reviewed. SELF MANAGEMENT GOALS have been discussed and patient's success at attaining the goal of low cholesterol was assessed. RECOMMENDATION given include regular exercise 3 days a week and low cholesterol/low fat diet. CLINICAL SUMMARY including written plan to identify barriers unique to the patient due to social or economic  reasons was discussed. 

## 2019-12-30 NOTE — Assessment & Plan Note (Signed)
AN INDIVIDUAL CARE PLAN was established and reinforced today.  The patient's status was assessed using clinical findings on exam, labs, and other diagnostic testing. Patient's success at meeting treatment goals based on disease specific evidence-bassed guidelines and found to be in good control. RECOMMENDATIONS include maintaining present medicines and treatment. 

## 2019-12-30 NOTE — Assessment & Plan Note (Signed)
AN INDIVIDUAL CARE PLAN was established and reinforced today.  The patient's status was assessed using clinical findings on exam, labs, and other diagnostic testing. Patient's success at meeting treatment goals based on disease specific evidence-bassed guidelines and found to be in good control. RECOMMENDATIONS include maintaining present medicines and treatment. Calcium and vitamin D

## 2019-12-30 NOTE — Assessment & Plan Note (Signed)
AN INDIVIDUAL CARE PLAN was established and reinforced today.  The patient's status was assessed using clinical findings on exam, labs, and other diagnostic testing. Patient's success at meeting treatment goals based on disease specific evidence-bassed guidelines and found to be in fair control. RECOMMENDATIONS include maintaining present medicines and treatment. 

## 2019-12-30 NOTE — Assessment & Plan Note (Signed)
Continue on low cholesterol diabetic diet and continue regular activity 3 days a week

## 2019-12-30 NOTE — Progress Notes (Signed)
Established Patient Office Visit  Subjective:  Patient ID: Catherine Stokes, female    DOB: 17-Mar-1951  Age: 69 y.o. MRN: [id number]  CC:  Chief Complaint  Patient presents with  . Hyperlipidemia  . Gastroesophageal Reflux    HPI VERTIA SKOV presents for Chronic visit 4 months.  Past Medical History:  Diagnosis Date  . GERD (gastroesophageal reflux disease)   . Hyperlipidemia   . Prediabetes    HPI:  GERD Patient has gastroesophageal reflux symptoms withesophagitis and LTRD.  The symptoms are moderate intensity.  Length of symptoms 10 years.  Medicines include OTC medicines.  Complications include none  Hyperlipidemia: Patient presents for follow up of hypertension.  Patient tolerating pravastatin well with side effects.  Patient was diagnosed with hypertension 10 so has been treated for hypertension for 10 years.Patient is working on maintaining diet and exercise regimen and follows up as directed..  Prediabetes: Patient has had prediabetes for 2 years.  She is following diet and has remained stable.  She is watching her weight and has no Diabetic symptoms    Past Surgical History:  Procedure Laterality Date  . CHOLECYSTECTOMY  07/24/2012   Procedure: LAPAROSCOPIC CHOLECYSTECTOMY WITH INTRAOPERATIVE CHOLANGIOGRAM;  Surgeon: Pedro Earls, MD;  Location: [facility];  Service: General;  Laterality: N/A;  . HERNIA REPAIR  [id number]   umbilical  . TUBAL LIGATION  1980    Family History  Problem Relation Age of Onset  . Diabetes Mother   . Heart disease Mother     Social History   Socioeconomic History  . Marital status: Married    Spouse name: Not on file  . Number of children: Not on file  . Years of education: Not on file  . Highest education level: Not on file  Occupational History  . Occupation: Farm  Tobacco Use  . Smoking status: Never Smoker  . Smokeless tobacco: Never Used  Substance and Sexual Activity  . Alcohol use: No  . Drug use: No  .  Sexual activity: Not Currently  Other Topics Concern  . Not on file  Social History Narrative  . Not on file   Social Determinants of Health   Financial Resource Strain:   . Difficulty of Paying Living Expenses: Not on file  Food Insecurity:   . Worried About Charity fundraiser in the Last Year: Not on file  . Ran Out of Food in the Last Year: Not on file  Transportation Needs:   . Lack of Transportation (Medical): Not on file  . Lack of Transportation (Non-Medical): Not on file  Physical Activity:   . Days of Exercise per Week: Not on file  . Minutes of Exercise per Session: Not on file  Stress:   . Feeling of Stress : Not on file  Social Connections:   . Frequency of Communication with Friends and Family: Not on file  . Frequency of Social Gatherings with Friends and Family: Not on file  . Attends Religious Services: Not on file  . Active Member of Clubs or Organizations: Not on file  . Attends Archivist Meetings: Not on file  . Marital Status: Not on file  Intimate Partner Violence:   . Fear of Current or Ex-Partner: Not on file  . Emotionally Abused: Not on file  . Physically Abused: Not on file  . Sexually Abused: Not on file    Outpatient Medications Prior to Visit  Medication Sig Dispense Refill  . acetaminophen (TYLENOL) 500  MG tablet Take 1 tablet by mouth once.    . metFORMIN (GLUCOPHAGE) 500 MG tablet Take 500 mg by mouth 2 (two) times daily with a meal.    . zolpidem (AMBIEN) 10 MG tablet Take 10 mg by mouth at bedtime as needed.    . pravastatin (PRAVACHOL) 40 MG tablet Take 40 mg by mouth daily.    . famotidine (PEPCID) 20 MG tablet Take 20 mg by mouth 2 (two) times daily.    Marland Kitchen omeprazole (PRILOSEC) 20 MG capsule Take 20 mg by mouth daily.    . Influenza Virus Vacc Split PF 0.5 ML SUSY Afluria 2014-2015(PF) 45 mcg (15 mcg x 3)/0.5 mL intramuscular syringe  inject 0.5 milliliter intramuscularly     No facility-administered medications prior to  visit.    No Known Allergies  ROS Review of Systems  Constitutional: Negative.   HENT: Negative.   Eyes: Negative.   Respiratory: Negative.   Cardiovascular: Negative.   Gastrointestinal: Positive for abdominal pain.  Endocrine: Negative.   Genitourinary: Negative.   Musculoskeletal: Negative.   Skin: Negative.   Allergic/Immunologic: Negative.   Neurological: Negative.   Psychiatric/Behavioral: Negative.       Objective:    Physical Exam  Constitutional: She is oriented to person, place, and time. She appears well-developed and well-nourished.  HENT:  Head: Normocephalic and atraumatic.  Eyes: Pupils are equal, round, and reactive to light. Conjunctivae and EOM are normal.  Cardiovascular: Normal rate, regular rhythm, normal heart sounds and intact distal pulses.  Pulmonary/Chest: Effort normal and breath sounds normal.  Abdominal: Soft. Bowel sounds are normal.  Musculoskeletal:        General: Normal range of motion.     Cervical back: Normal range of motion and neck supple.  Neurological: She is alert and oriented to person, place, and time. She has normal reflexes.  Skin: Skin is warm and dry.    BP 120/70 (BP Location: Left Arm, Patient Position: Sitting)   Pulse 74   Temp 97.6 F (36.4 C) (Temporal)   Ht 5\' 6"  (1.676 m)   Wt 145 lb (65.8 kg)   BMI 23.40 kg/m  Wt Readings from Last 3 Encounters:  12/30/19 145 lb (65.8 kg)  07/24/12 157 lb (71.2 kg)  07/17/12 157 lb (71.2 kg)     Health Maintenance Due  Topic Date Due  . Hepatitis C Screening  1950-12-15  . URINE MICROALBUMIN  11/17/1960  . TETANUS/TDAP  11/17/1969    There are no preventive care reminders to display for this patient.  No results found for: TSH Lab Results  Component Value Date   WBC 10.7 (H) 07/25/2012   HGB 10.7 (L) 07/25/2012   HCT 31.5 (L) 07/25/2012   MCV 89.2 07/25/2012   PLT 289 07/25/2012   Lab Results  Component Value Date   NA 138 07/25/2012   K 4.1 07/25/2012    CO2 26 07/25/2012   GLUCOSE 137 (H) 07/25/2012   BUN 9 07/25/2012   CREATININE 0.71 07/25/2012   BILITOT 0.2 (L) 07/25/2012   ALKPHOS 65 07/25/2012   AST 26 07/25/2012   ALT 29 07/25/2012   PROT 5.7 (L) 07/25/2012   ALBUMIN 3.0 (L) 07/25/2012   CALCIUM 9.0 07/25/2012   No results found for: CHOL No results found for: HDL No results found for: LDLCALC No results found for: TRIG No results found for: CHOLHDL No results found for: HGBA1C    Assessment & Plan:   Problem List Items Addressed This Visit  Digestive   GERD with esophagitis (Chronic)    AN INDIVIDUAL CARE PLAN was established and reinforced today.  The patient's status was assessed using clinical findings on exam, labs, and other diagnostic testing. Patient's success at meeting treatment goals based on disease specific evidence-bassed guidelines and found to be in good control. RECOMMENDATIONS include maintaining present medicines and treatment.       Relevant Orders   CBC with Differential     Musculoskeletal and Integument   Osteopenia (Chronic)    AN INDIVIDUAL CARE PLAN was established and reinforced today.  The patient's status was assessed using clinical findings on exam, labs, and other diagnostic testing. Patient's success at meeting treatment goals based on disease specific evidence-bassed guidelines and found to be in good control. RECOMMENDATIONS include maintaining present medicines and treatment. Calcium and vitamin D        Other   Mixed hyperlipidemia (Chronic)    AN INDIVIDUAL CARE PLAN was established and reinforced today.  The patient's status was assessed using clinical findings on exam, lab and other diagnostic tests. The patient's disease status was assessed based on evidence-based guidelines and found to be good controlled. MEDICATIONS were reviewed. SELF MANAGEMENT GOALS have been discussed and patient's success at attaining the goal of low cholesterol was assessed. RECOMMENDATION  given include regular exercise 3 days a week and low cholesterol/low fat diet. CLINICAL SUMMARY including written plan to identify barriers unique to the patient due to social or economic  reasons was discussed.      Relevant Medications   pravastatin (PRAVACHOL) 40 MG tablet   Other Relevant Orders   Comprehensive metabolic panel   Lipid Panel   Adjustment insomnia (Chronic)    AN INDIVIDUAL CARE PLAN was established and reinforced today.  The patient's status was assessed using clinical findings on exam, labs, and other diagnostic testing. Patient's success at meeting treatment goals based on disease specific evidence-bassed guidelines and found to be in fair control. RECOMMENDATIONS include maintaining present medicines and treatment.      Prediabetes    Continue on low cholesterol diabetic diet and continue regular activity 3 days a week      Relevant Orders   Hemoglobin A1c   RESOLVED: Right shoulder pain      Meds ordered this encounter  Medications  . pravastatin (PRAVACHOL) 40 MG tablet    Sig: Take 1 tablet (40 mg total) by mouth daily.    Dispense:  90 tablet    Refill:  2    Follow-up: Return in about 4 months (around 04/28/2020).    Reinaldo Meeker, MD

## 2019-12-30 NOTE — Patient Instructions (Signed)
Continue your medicines

## 2019-12-31 DIAGNOSIS — M6281 Muscle weakness (generalized): Secondary | ICD-10-CM | POA: Diagnosis not present

## 2019-12-31 DIAGNOSIS — M25611 Stiffness of right shoulder, not elsewhere classified: Secondary | ICD-10-CM | POA: Diagnosis not present

## 2019-12-31 DIAGNOSIS — M25511 Pain in right shoulder: Secondary | ICD-10-CM | POA: Diagnosis not present

## 2019-12-31 LAB — COMPREHENSIVE METABOLIC PANEL
ALT: 21 IU/L (ref 0–32)
AST: 19 IU/L (ref 0–40)
Albumin/Globulin Ratio: 1.6 (ref 1.2–2.2)
Albumin: 4.1 g/dL (ref 3.8–4.8)
Alkaline Phosphatase: 91 IU/L (ref 39–117)
BUN/Creatinine Ratio: 22 (ref 12–28)
BUN: 14 mg/dL (ref 8–27)
Bilirubin Total: <0.2 mg/dL (ref 0.0–1.2)
CO2: 23 mmol/L (ref 20–29)
Calcium: 10 mg/dL (ref 8.7–10.3)
Chloride: 105 mmol/L (ref 96–106)
Creatinine, Ser: 0.65 mg/dL (ref 0.57–1.00)
GFR calc Af Amer: 105 mL/min/{1.73_m2} (ref >59)
GFR calc non Af Amer: 91 mL/min/{1.73_m2} (ref >59)
Globulin, Total: 2.6 g/dL (ref 1.5–4.5)
Glucose: 92 mg/dL (ref 65–99)
Potassium: 5.2 mmol/L (ref 3.5–5.2)
Sodium: 142 mmol/L (ref 134–144)
Total Protein: 6.7 g/dL (ref 6.0–8.5)

## 2019-12-31 LAB — LIPID PANEL
Chol/HDL Ratio: 3.2 ratio (ref 0.0–4.4)
Cholesterol, Total: 170 mg/dL (ref 100–199)
HDL: 53 mg/dL (ref >39)
LDL Chol Calc (NIH): 91 mg/dL (ref 0–99)
Triglycerides: 149 mg/dL (ref 0–149)
VLDL Cholesterol Cal: 26 mg/dL (ref 5–40)

## 2019-12-31 LAB — CBC WITH DIFFERENTIAL/PLATELET
Basophils Absolute: 0.1 10*3/uL (ref 0.0–0.2)
Basos: 2 %
EOS (ABSOLUTE): 0.2 10*3/uL (ref 0.0–0.4)
Eos: 3 %
Hematocrit: 34.7 % (ref 34.0–46.6)
Hemoglobin: 11.5 g/dL (ref 11.1–15.9)
Immature Grans (Abs): 0 10*3/uL (ref 0.0–0.1)
Immature Granulocytes: 0 %
Lymphocytes Absolute: 1.6 10*3/uL (ref 0.7–3.1)
Lymphs: 31 %
MCH: 29.9 pg (ref 26.6–33.0)
MCHC: 33.1 g/dL (ref 31.5–35.7)
MCV: 90 fL (ref 79–97)
Monocytes Absolute: 0.5 10*3/uL (ref 0.1–0.9)
Monocytes: 10 %
Neutrophils Absolute: 2.8 10*3/uL (ref 1.4–7.0)
Neutrophils: 54 %
Platelets: 387 10*3/uL (ref 150–450)
RBC: 3.85 x10E6/uL (ref 3.77–5.28)
RDW: 13 % (ref 11.7–15.4)
WBC: 5.3 10*3/uL (ref 3.4–10.8)

## 2019-12-31 LAB — CARDIOVASCULAR RISK ASSESSMENT

## 2019-12-31 LAB — HEMOGLOBIN A1C
Est. average glucose Bld gHb Est-mCnc: 120 mg/dL
Hgb A1c MFr Bld: 5.8 % — ABNORMAL HIGH (ref 4.8–5.6)

## 2020-01-02 DIAGNOSIS — M25511 Pain in right shoulder: Secondary | ICD-10-CM | POA: Diagnosis not present

## 2020-01-02 DIAGNOSIS — M6281 Muscle weakness (generalized): Secondary | ICD-10-CM | POA: Diagnosis not present

## 2020-01-02 DIAGNOSIS — M25611 Stiffness of right shoulder, not elsewhere classified: Secondary | ICD-10-CM | POA: Diagnosis not present

## 2020-01-03 ENCOUNTER — Other Ambulatory Visit: Payer: Self-pay | Admitting: Legal Medicine

## 2020-01-03 DIAGNOSIS — E782 Mixed hyperlipidemia: Secondary | ICD-10-CM

## 2020-01-06 DIAGNOSIS — M25511 Pain in right shoulder: Secondary | ICD-10-CM | POA: Diagnosis not present

## 2020-01-06 DIAGNOSIS — M6281 Muscle weakness (generalized): Secondary | ICD-10-CM | POA: Diagnosis not present

## 2020-01-06 DIAGNOSIS — M25611 Stiffness of right shoulder, not elsewhere classified: Secondary | ICD-10-CM | POA: Diagnosis not present

## 2020-01-09 DIAGNOSIS — M25611 Stiffness of right shoulder, not elsewhere classified: Secondary | ICD-10-CM | POA: Diagnosis not present

## 2020-01-09 DIAGNOSIS — M25511 Pain in right shoulder: Secondary | ICD-10-CM | POA: Diagnosis not present

## 2020-01-09 DIAGNOSIS — M6281 Muscle weakness (generalized): Secondary | ICD-10-CM | POA: Diagnosis not present

## 2020-01-12 DIAGNOSIS — M25511 Pain in right shoulder: Secondary | ICD-10-CM | POA: Diagnosis not present

## 2020-01-12 DIAGNOSIS — M25611 Stiffness of right shoulder, not elsewhere classified: Secondary | ICD-10-CM | POA: Diagnosis not present

## 2020-01-12 DIAGNOSIS — M6281 Muscle weakness (generalized): Secondary | ICD-10-CM | POA: Diagnosis not present

## 2020-01-14 DIAGNOSIS — M25511 Pain in right shoulder: Secondary | ICD-10-CM | POA: Diagnosis not present

## 2020-01-14 DIAGNOSIS — M25611 Stiffness of right shoulder, not elsewhere classified: Secondary | ICD-10-CM | POA: Diagnosis not present

## 2020-01-14 DIAGNOSIS — M6281 Muscle weakness (generalized): Secondary | ICD-10-CM | POA: Diagnosis not present

## 2020-01-16 DIAGNOSIS — M25511 Pain in right shoulder: Secondary | ICD-10-CM | POA: Diagnosis not present

## 2020-01-16 DIAGNOSIS — M25611 Stiffness of right shoulder, not elsewhere classified: Secondary | ICD-10-CM | POA: Diagnosis not present

## 2020-01-16 DIAGNOSIS — M6281 Muscle weakness (generalized): Secondary | ICD-10-CM | POA: Diagnosis not present

## 2020-01-19 DIAGNOSIS — M6281 Muscle weakness (generalized): Secondary | ICD-10-CM | POA: Diagnosis not present

## 2020-01-19 DIAGNOSIS — M25611 Stiffness of right shoulder, not elsewhere classified: Secondary | ICD-10-CM | POA: Diagnosis not present

## 2020-01-19 DIAGNOSIS — M25511 Pain in right shoulder: Secondary | ICD-10-CM | POA: Diagnosis not present

## 2020-01-21 DIAGNOSIS — M6281 Muscle weakness (generalized): Secondary | ICD-10-CM | POA: Diagnosis not present

## 2020-01-21 DIAGNOSIS — M25511 Pain in right shoulder: Secondary | ICD-10-CM | POA: Diagnosis not present

## 2020-01-21 DIAGNOSIS — M25611 Stiffness of right shoulder, not elsewhere classified: Secondary | ICD-10-CM | POA: Diagnosis not present

## 2020-01-23 DIAGNOSIS — M25511 Pain in right shoulder: Secondary | ICD-10-CM | POA: Diagnosis not present

## 2020-01-23 DIAGNOSIS — M25611 Stiffness of right shoulder, not elsewhere classified: Secondary | ICD-10-CM | POA: Diagnosis not present

## 2020-01-23 DIAGNOSIS — M6281 Muscle weakness (generalized): Secondary | ICD-10-CM | POA: Diagnosis not present

## 2020-01-26 DIAGNOSIS — M25611 Stiffness of right shoulder, not elsewhere classified: Secondary | ICD-10-CM | POA: Diagnosis not present

## 2020-01-26 DIAGNOSIS — M6281 Muscle weakness (generalized): Secondary | ICD-10-CM | POA: Diagnosis not present

## 2020-01-26 DIAGNOSIS — M25511 Pain in right shoulder: Secondary | ICD-10-CM | POA: Diagnosis not present

## 2020-01-29 DIAGNOSIS — M25511 Pain in right shoulder: Secondary | ICD-10-CM | POA: Diagnosis not present

## 2020-01-29 DIAGNOSIS — M6281 Muscle weakness (generalized): Secondary | ICD-10-CM | POA: Diagnosis not present

## 2020-01-29 DIAGNOSIS — M25611 Stiffness of right shoulder, not elsewhere classified: Secondary | ICD-10-CM | POA: Diagnosis not present

## 2020-02-02 DIAGNOSIS — M19011 Primary osteoarthritis, right shoulder: Secondary | ICD-10-CM | POA: Diagnosis not present

## 2020-02-06 DIAGNOSIS — M6281 Muscle weakness (generalized): Secondary | ICD-10-CM | POA: Diagnosis not present

## 2020-02-06 DIAGNOSIS — M25511 Pain in right shoulder: Secondary | ICD-10-CM | POA: Diagnosis not present

## 2020-02-06 DIAGNOSIS — M25611 Stiffness of right shoulder, not elsewhere classified: Secondary | ICD-10-CM | POA: Diagnosis not present

## 2020-02-09 DIAGNOSIS — M25511 Pain in right shoulder: Secondary | ICD-10-CM | POA: Diagnosis not present

## 2020-02-09 DIAGNOSIS — M6281 Muscle weakness (generalized): Secondary | ICD-10-CM | POA: Diagnosis not present

## 2020-02-09 DIAGNOSIS — M25611 Stiffness of right shoulder, not elsewhere classified: Secondary | ICD-10-CM | POA: Diagnosis not present

## 2020-02-11 DIAGNOSIS — M25511 Pain in right shoulder: Secondary | ICD-10-CM | POA: Diagnosis not present

## 2020-02-11 DIAGNOSIS — M25611 Stiffness of right shoulder, not elsewhere classified: Secondary | ICD-10-CM | POA: Diagnosis not present

## 2020-02-11 DIAGNOSIS — M6281 Muscle weakness (generalized): Secondary | ICD-10-CM | POA: Diagnosis not present

## 2020-02-17 DIAGNOSIS — M25611 Stiffness of right shoulder, not elsewhere classified: Secondary | ICD-10-CM | POA: Diagnosis not present

## 2020-02-17 DIAGNOSIS — M6281 Muscle weakness (generalized): Secondary | ICD-10-CM | POA: Diagnosis not present

## 2020-02-17 DIAGNOSIS — M25511 Pain in right shoulder: Secondary | ICD-10-CM | POA: Diagnosis not present

## 2020-02-19 DIAGNOSIS — M6281 Muscle weakness (generalized): Secondary | ICD-10-CM | POA: Diagnosis not present

## 2020-02-19 DIAGNOSIS — M25611 Stiffness of right shoulder, not elsewhere classified: Secondary | ICD-10-CM | POA: Diagnosis not present

## 2020-02-19 DIAGNOSIS — M25511 Pain in right shoulder: Secondary | ICD-10-CM | POA: Diagnosis not present

## 2020-02-24 DIAGNOSIS — M6281 Muscle weakness (generalized): Secondary | ICD-10-CM | POA: Diagnosis not present

## 2020-02-24 DIAGNOSIS — M25611 Stiffness of right shoulder, not elsewhere classified: Secondary | ICD-10-CM | POA: Diagnosis not present

## 2020-02-24 DIAGNOSIS — M25511 Pain in right shoulder: Secondary | ICD-10-CM | POA: Diagnosis not present

## 2020-02-26 ENCOUNTER — Telehealth: Payer: Self-pay | Admitting: Legal Medicine

## 2020-02-26 DIAGNOSIS — M25511 Pain in right shoulder: Secondary | ICD-10-CM | POA: Diagnosis not present

## 2020-02-26 DIAGNOSIS — M6281 Muscle weakness (generalized): Secondary | ICD-10-CM | POA: Diagnosis not present

## 2020-02-26 DIAGNOSIS — M25611 Stiffness of right shoulder, not elsewhere classified: Secondary | ICD-10-CM | POA: Diagnosis not present

## 2020-02-26 NOTE — Progress Notes (Signed)
  Chronic Care Management   Outreach Note  02/26/2020 Name: Catherine Stokes MRN: [id number] DOB: 1951/03/04  Referred by: Lillard Anes, MD Reason for referral : No chief complaint on file.   An unsuccessful telephone outreach was attempted today. The patient was referred to the pharmacist for assistance with care management and care coordination.   Follow Up Plan:   Earney Hamburg Upstream Scheduler

## 2020-03-02 DIAGNOSIS — M25611 Stiffness of right shoulder, not elsewhere classified: Secondary | ICD-10-CM | POA: Diagnosis not present

## 2020-03-02 DIAGNOSIS — M6281 Muscle weakness (generalized): Secondary | ICD-10-CM | POA: Diagnosis not present

## 2020-03-02 DIAGNOSIS — M25511 Pain in right shoulder: Secondary | ICD-10-CM | POA: Diagnosis not present

## 2020-03-03 ENCOUNTER — Telehealth: Payer: Self-pay | Admitting: Legal Medicine

## 2020-03-03 IMAGING — US US BREAST*L* LIMITED INC AXILLA
1 series · 8 of 8 positions shown · non-contrast
Comparison: 09/26/2019 and earlier

CLINICAL DATA: Patient returns after screening study for evaluation
of a possible LEFT breast mass.

EXAM:
DIGITAL DIAGNOSTIC LEFT MAMMOGRAM WITH CAD AND TOMO
ULTRASOUND LEFT BREAST

[Series 1: us breast*left* limited inc axilla · 0.07mm/px · 8 of 8 slices shown]
[im 1/8]
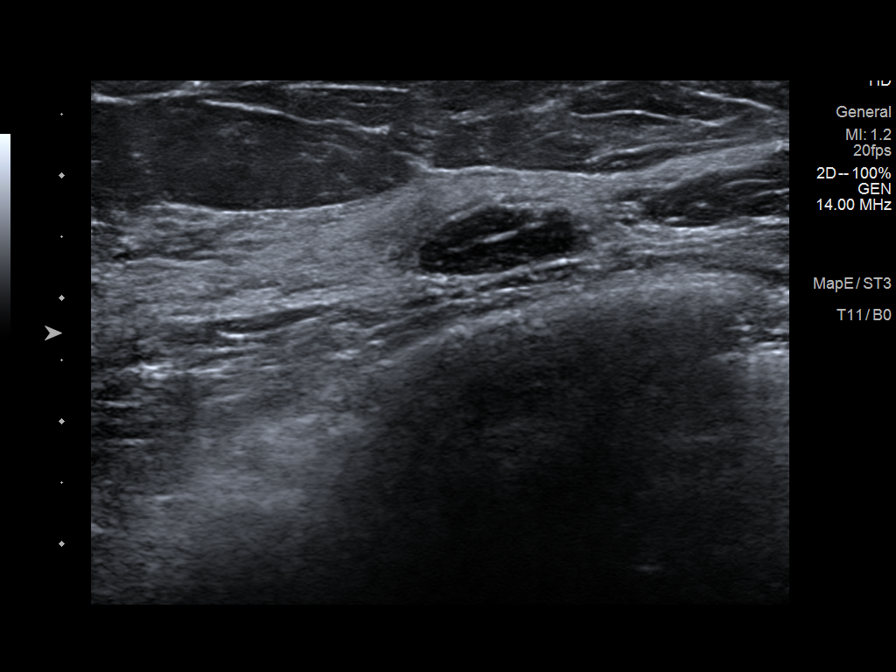
[im 2/8]
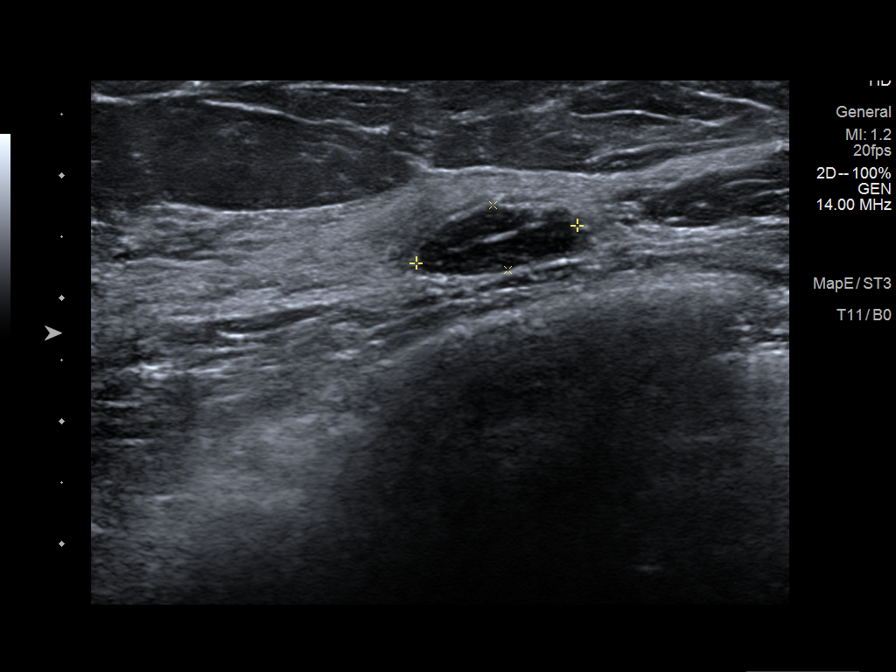
[im 3/8]
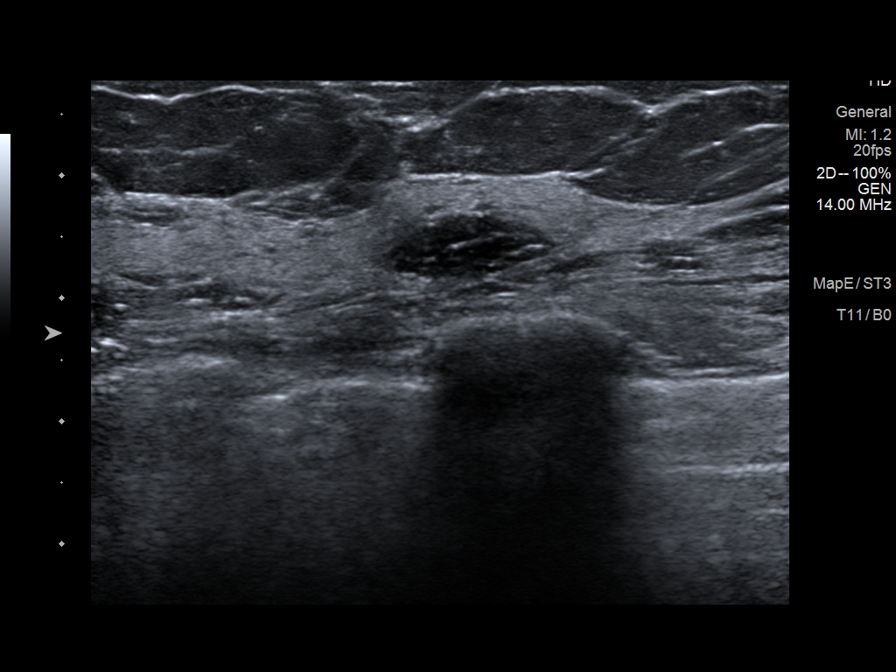
[im 4/8]
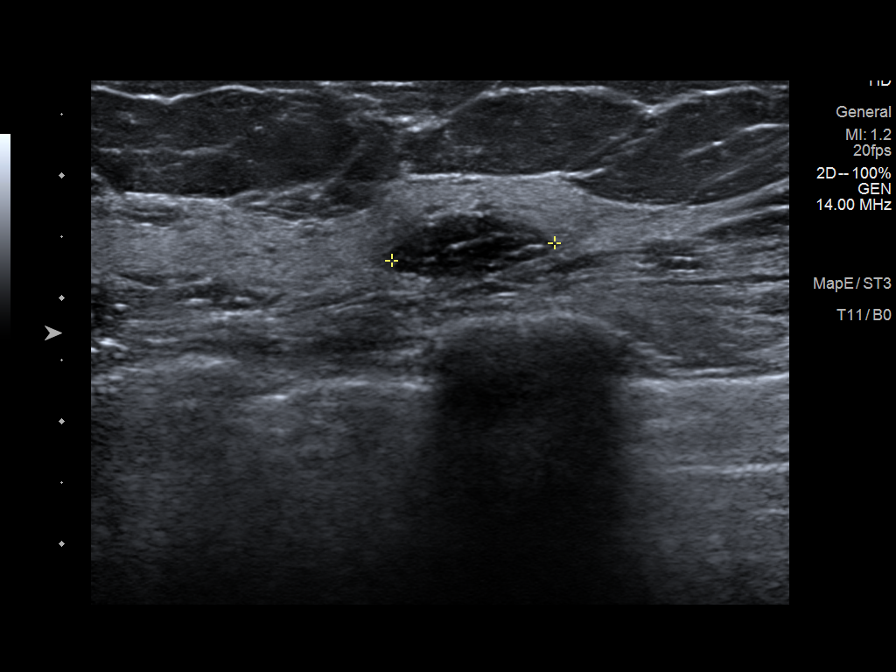
[im 5/8]
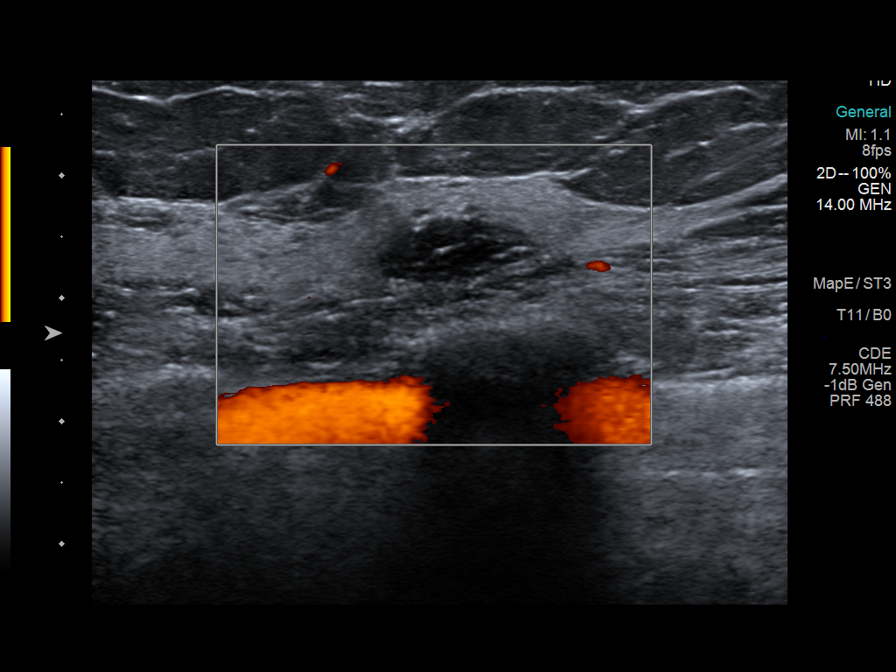
[im 6/8]
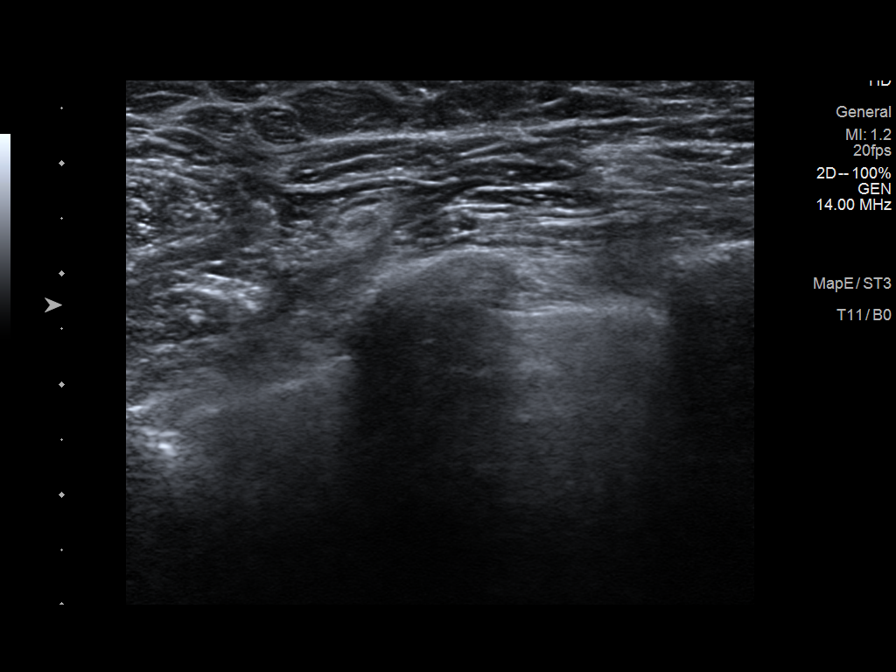
[im 7/8]
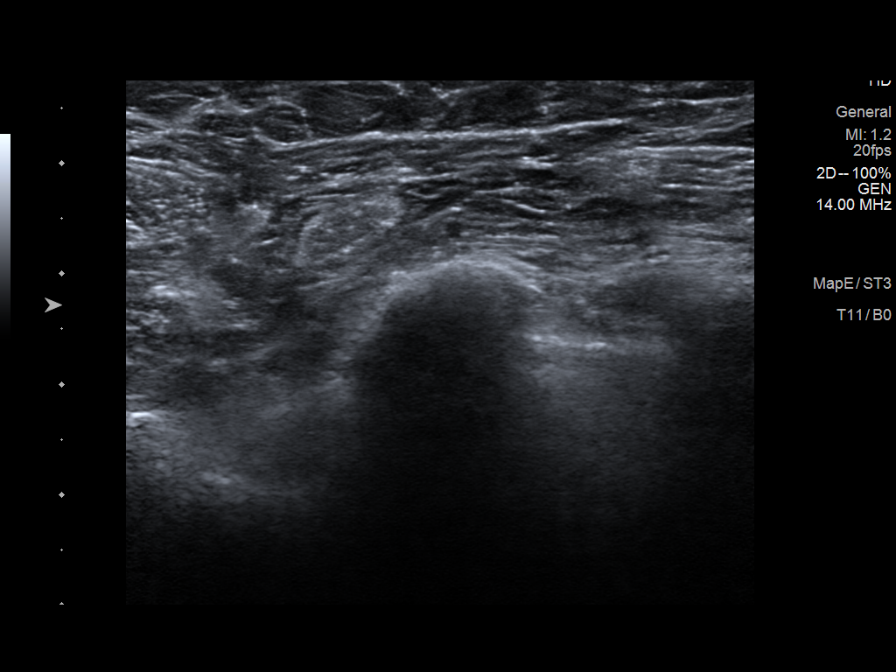
[im 8/8]
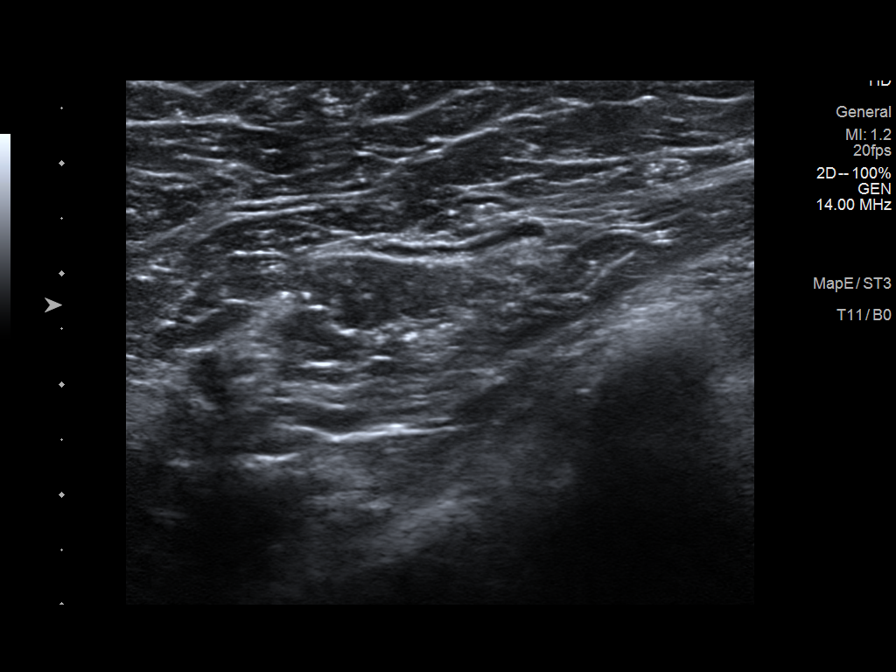

[8 of 8 positions shown; findings below may reference images not displayed]

ACR Breast Density Category c: The breast tissue is heterogeneously
dense, which may obscure small masses.
FINDINGS: Additional 2-D and 3-D images are performed. These views show
possible oval mass in the UPPER-OUTER QUADRANT of the LEFT breast.

Mammographic images were processed with CAD.

On physical exam, I palpate no abnormality in the UPPER-OUTER
QUADRANT of the LEFT breast.

Targeted ultrasound is performed, showing a hypoechoic oval parallel
lesion with internal echoes in the 2 o'clock location of the LEFT
breast 10 centimeters from the nipple which measures 1.3 x 1.4 x
centimeters. No associated internal blood flow or posterior acoustic
shadowing. Evaluation of the LEFT axilla is negative.
IMPRESSION: Indeterminate mass in the 2 o'clock location of the LEFT breast.

No LEFT axillary adenopathy.

RECOMMENDATION:
Ultrasound-guided core biopsy of mass in the 2 o'clock location of
the LEFT breast. The patient is scheduled for shoulder surgery
[REDACTED] 10/09/2019. We will attempt to schedule biopsy in order to
have results prior to surgery.

I have discussed the findings and recommendations with the patient.
If applicable, a reminder letter will be sent to the patient
regarding the next appointment.

BI-RADS CATEGORY  4: Suspicious.

## 2020-03-03 NOTE — Progress Notes (Signed)
  Chronic Care Management   Outreach Note  03/03/2020 Name: Catherine Stokes MRN: [id number] DOB: 10-13-1951  Referred by: Lillard Anes, MD Reason for referral : No chief complaint on file.   A second unsuccessful telephone outreach was attempted today. The patient was referred to pharmacist for assistance with care management and care coordination.  Follow Up Plan:   Earney Hamburg Upstream Scheduler

## 2020-03-04 DIAGNOSIS — M25511 Pain in right shoulder: Secondary | ICD-10-CM | POA: Diagnosis not present

## 2020-03-04 DIAGNOSIS — M25611 Stiffness of right shoulder, not elsewhere classified: Secondary | ICD-10-CM | POA: Diagnosis not present

## 2020-03-04 DIAGNOSIS — M6281 Muscle weakness (generalized): Secondary | ICD-10-CM | POA: Diagnosis not present

## 2020-03-09 DIAGNOSIS — M25511 Pain in right shoulder: Secondary | ICD-10-CM | POA: Diagnosis not present

## 2020-03-09 DIAGNOSIS — M25611 Stiffness of right shoulder, not elsewhere classified: Secondary | ICD-10-CM | POA: Diagnosis not present

## 2020-03-09 DIAGNOSIS — M6281 Muscle weakness (generalized): Secondary | ICD-10-CM | POA: Diagnosis not present

## 2020-03-11 ENCOUNTER — Telehealth: Payer: Self-pay | Admitting: Legal Medicine

## 2020-03-11 NOTE — Progress Notes (Signed)
  Chronic Care Management   Outreach Note  03/11/2020 Name: Catherine Stokes MRN: [id number] DOB: 10-10-1951  Referred by: Lillard Anes, MD Reason for referral : No chief complaint on file.   An unsuccessful telephone outreach was attempted today. The patient was referred to the pharmacist for assistance with care management and care coordination.   Follow Up Plan:   Earney Hamburg Upstream Scheduler

## 2020-03-17 DIAGNOSIS — M25611 Stiffness of right shoulder, not elsewhere classified: Secondary | ICD-10-CM | POA: Diagnosis not present

## 2020-03-17 DIAGNOSIS — M25511 Pain in right shoulder: Secondary | ICD-10-CM | POA: Diagnosis not present

## 2020-03-17 DIAGNOSIS — M6281 Muscle weakness (generalized): Secondary | ICD-10-CM | POA: Diagnosis not present

## 2020-03-23 DIAGNOSIS — M6281 Muscle weakness (generalized): Secondary | ICD-10-CM | POA: Diagnosis not present

## 2020-03-23 DIAGNOSIS — M25611 Stiffness of right shoulder, not elsewhere classified: Secondary | ICD-10-CM | POA: Diagnosis not present

## 2020-03-23 DIAGNOSIS — M25511 Pain in right shoulder: Secondary | ICD-10-CM | POA: Diagnosis not present

## 2020-03-25 ENCOUNTER — Telehealth: Payer: Self-pay | Admitting: Legal Medicine

## 2020-03-25 NOTE — Progress Notes (Signed)
°  Chronic Care Management   Outreach Note  03/25/2020 Name: Catherine Stokes MRN: [id number] DOB: [birth year]  Referred by: Lillard Anes, MD Reason for referral : No chief complaint on file.   An unsuccessful telephone outreach was attempted today. The patient was referred to the pharmacist for assistance with care management and care coordination.   This note is not being shared with the patient for the following reason: To respect privacy (The patient or proxy has requested that the information not be shared).  Follow Up Plan:   Earney Hamburg Upstream Scheduler

## 2020-04-01 DIAGNOSIS — M25611 Stiffness of right shoulder, not elsewhere classified: Secondary | ICD-10-CM | POA: Diagnosis not present

## 2020-04-01 DIAGNOSIS — M6281 Muscle weakness (generalized): Secondary | ICD-10-CM | POA: Diagnosis not present

## 2020-04-01 DIAGNOSIS — M25511 Pain in right shoulder: Secondary | ICD-10-CM | POA: Diagnosis not present

## 2020-04-06 DIAGNOSIS — M6281 Muscle weakness (generalized): Secondary | ICD-10-CM | POA: Diagnosis not present

## 2020-04-06 DIAGNOSIS — M25511 Pain in right shoulder: Secondary | ICD-10-CM | POA: Diagnosis not present

## 2020-04-06 DIAGNOSIS — M25611 Stiffness of right shoulder, not elsewhere classified: Secondary | ICD-10-CM | POA: Diagnosis not present

## 2020-04-28 ENCOUNTER — Encounter: Payer: Self-pay | Admitting: Legal Medicine

## 2020-04-28 ENCOUNTER — Other Ambulatory Visit: Payer: Self-pay

## 2020-04-28 ENCOUNTER — Ambulatory Visit (INDEPENDENT_AMBULATORY_CARE_PROVIDER_SITE_OTHER): Payer: PPO | Admitting: Legal Medicine

## 2020-04-28 VITALS — BP 124/70 | HR 75 | Temp 97.0°F | Resp 16 | Ht 66.0 in | Wt 145.6 lb

## 2020-04-28 DIAGNOSIS — K2100 Gastro-esophageal reflux disease with esophagitis, without bleeding: Secondary | ICD-10-CM | POA: Diagnosis not present

## 2020-04-28 DIAGNOSIS — F5102 Adjustment insomnia: Secondary | ICD-10-CM

## 2020-04-28 DIAGNOSIS — E782 Mixed hyperlipidemia: Secondary | ICD-10-CM | POA: Diagnosis not present

## 2020-04-28 DIAGNOSIS — K5909 Other constipation: Secondary | ICD-10-CM | POA: Insufficient documentation

## 2020-04-28 DIAGNOSIS — R7303 Prediabetes: Secondary | ICD-10-CM

## 2020-04-28 DIAGNOSIS — Z6823 Body mass index (BMI) 23.0-23.9, adult: Secondary | ICD-10-CM

## 2020-04-28 DIAGNOSIS — M8588 Other specified disorders of bone density and structure, other site: Secondary | ICD-10-CM

## 2020-04-28 DIAGNOSIS — N3000 Acute cystitis without hematuria: Secondary | ICD-10-CM | POA: Diagnosis not present

## 2020-04-28 LAB — POCT URINALYSIS DIPSTICK
Bilirubin, UA: NEGATIVE
Blood, UA: NEGATIVE
Glucose, UA: NEGATIVE
Ketones, UA: NEGATIVE
Nitrite, UA: NEGATIVE
Protein, UA: POSITIVE — AB
Spec Grav, UA: >=1.03 — AB (ref 1.010–1.025)
Urobilinogen, UA: 0.2 E.U./dL
pH, UA: 6 (ref 5.0–8.0)

## 2020-04-28 MED ORDER — NITROFURANTOIN MONOHYD MACRO 100 MG PO CAPS: 100.0000 mg | ORAL_CAPSULE | Freq: Two times a day (BID) | ORAL | 0 refills | Status: DC

## 2020-04-28 NOTE — Progress Notes (Signed)
Established Patient Office Visit  Subjective:  Patient ID: Catherine Stokes, female    DOB: 21-Aug-1951  Age: 69 y.o. MRN: [id number]  CC:  Chief Complaint  Patient presents with  . Hyperlipidemia  . Prediabetes  . Gastroesophageal Reflux    HPI Catherine Stokes presents for Chronic visit  UTI for one week with dysuria and frequency. Husband was found to have metastases to adrenal gland and is under chemotherapy.  An individual hypertension care plan was established and reinforced today.  The patient's status was assessed using clinical findings on exam and labs or diagnostic tests. The patient's success at meeting treatment goals on disease specific evidence-based guidelines and found to be well controlled. SELF MANAGEMENT: The patient and I together assessed ways to personally work towards obtaining the recommended goals. RECOMMENDATIONS: avoid decongestants found in common cold remedies, decrease consumption of alcohol, perform routine monitoring of BP with home BP cuff, exercise, reduction of dietary salt, take medicines as prescribed, try not to miss doses and quit smoking.  Regular exercise and maintaining a healthy weight is needed.  Stress reduction may help. A CLINICAL SUMMARY including written plan identify barriers to care unique to individual due to social or financial issues.  We attempt to mutually creat solutions for individual and family understanding.  Patient has prediabetes but is on diabetic diet and metformin, no neuropathy.  Patient has gastroesophageal reflux symptoms withoutesophagitis and LTRD.  The symptoms are mild intensity.  Length of symptoms 10 years.  Medicines include OTC now.  Complications include none.  Past Medical History:  Diagnosis Date  . Prediabetes     Past Surgical History:  Procedure Laterality Date  . CHOLECYSTECTOMY  07/24/2012   Procedure: LAPAROSCOPIC CHOLECYSTECTOMY WITH INTRAOPERATIVE CHOLANGIOGRAM;  Surgeon: Pedro Earls,  MD;  Location: [facility];  Service: General;  Laterality: N/A;  . HERNIA REPAIR  [id number]   umbilical  . TUBAL LIGATION  1980    Family History  Problem Relation Age of Onset  . Diabetes Mother   . Heart disease Mother     Social History   Socioeconomic History  . Marital status: Married    Spouse name: Not on file  . Number of children: Not on file  . Years of education: Not on file  . Highest education level: Not on file  Occupational History  . Occupation: Farm  Tobacco Use  . Smoking status: Never Smoker  . Smokeless tobacco: Never Used  Substance and Sexual Activity  . Alcohol use: No  . Drug use: No  . Sexual activity: Not Currently  Other Topics Concern  . Not on file  Social History Narrative  . Not on file   Social Determinants of Health   Financial Resource Strain:   . Difficulty of Paying Living Expenses:   Food Insecurity:   . Worried About Charity fundraiser in the Last Year:   . Arboriculturist in the Last Year:   Transportation Needs:   . Film/video editor (Medical):   Marland Kitchen Lack of Transportation (Non-Medical):   Physical Activity:   . Days of Exercise per Week:   . Minutes of Exercise per Session:   Stress:   . Feeling of Stress :   Social Connections:   . Frequency of Communication with Friends and Family:   . Frequency of Social Gatherings with Friends and Family:   . Attends Religious Services:   . Active Member of Clubs or Organizations:   . Attends  Club or Organization Meetings:   Marland Kitchen Marital Status:   Intimate Partner Violence:   . Fear of Current or Ex-Partner:   . Emotionally Abused:   Marland Kitchen Physically Abused:   . Sexually Abused:     Outpatient Medications Prior to Visit  Medication Sig Dispense Refill  . metFORMIN (GLUCOPHAGE) 500 MG tablet Take 500 mg by mouth 2 (two) times daily with a meal.    . pravastatin (PRAVACHOL) 40 MG tablet TAKE 1 TABLET BY MOUTH ONCE DAILY. 90 tablet 2  . zolpidem (AMBIEN) 10 MG tablet Take 10 mg by mouth  at bedtime as needed.    Marland Kitchen acetaminophen (TYLENOL) 500 MG tablet Take 1 tablet by mouth once.    . famotidine (PEPCID) 20 MG tablet Take 20 mg by mouth 2 (two) times daily.    Marland Kitchen omeprazole (PRILOSEC) 20 MG capsule Take 20 mg by mouth daily.     No facility-administered medications prior to visit.    No Known Allergies  ROS Review of Systems  Constitutional: Negative.   HENT: Negative.   Eyes: Negative.   Respiratory: Negative.   Cardiovascular: Negative.   Gastrointestinal: Negative.   Endocrine: Negative.   Genitourinary: Negative.   Musculoskeletal: Negative.   Skin: Negative.   Neurological: Negative.   Psychiatric/Behavioral: Negative.       Objective:    Physical Exam  Constitutional: She is oriented to person, place, and time. She appears well-developed and well-nourished.  HENT:  Head: Normocephalic and atraumatic.  Right Ear: External ear normal.  Left Ear: External ear normal.  Nose: Nose normal.  Mouth/Throat: Oropharynx is clear and moist.  Eyes: Pupils are equal, round, and reactive to light. Conjunctivae and EOM are normal.  Cardiovascular: Normal rate, regular rhythm, normal heart sounds and intact distal pulses.  Pulmonary/Chest: Effort normal and breath sounds normal.  Abdominal: Soft. Bowel sounds are normal.  Musculoskeletal:        General: Normal range of motion.     Cervical back: Normal range of motion and neck supple.  Neurological: She is alert and oriented to person, place, and time. She has normal reflexes.  Skin: Skin is warm and dry.  Psychiatric: She has a normal mood and affect. Her behavior is normal. Judgment and thought content normal.  Vitals reviewed.   BP 124/70   Pulse 75   Temp (!) 97 F (36.1 C)   Resp 16   Ht 5\' 6"  (1.676 m)   Wt 145 lb 9.6 oz (66 kg)   SpO2 98%   BMI 23.50 kg/m  Wt Readings from Last 3 Encounters:  04/28/20 145 lb 9.6 oz (66 kg)  12/30/19 145 lb (65.8 kg)  07/24/12 157 lb (71.2 kg)     Health  Maintenance Due  Topic Date Due  . Hepatitis C Screening  Never done  . URINE MICROALBUMIN  Never done  . COVID-19 Vaccine (1) Never done  . TETANUS/TDAP  Never done    There are no preventive care reminders to display for this patient.  No results found for: TSH Lab Results  Component Value Date   WBC 5.3 12/30/2019   HGB 11.5 12/30/2019   HCT 34.7 12/30/2019   MCV 90 12/30/2019   PLT 387 12/30/2019   Lab Results  Component Value Date   NA 142 12/30/2019   K 5.2 12/30/2019   CO2 23 12/30/2019   GLUCOSE 92 12/30/2019   BUN 14 12/30/2019   CREATININE 0.65 12/30/2019   BILITOT <0.2 12/30/2019  ALKPHOS 91 12/30/2019   AST 19 12/30/2019   ALT 21 12/30/2019   PROT 6.7 12/30/2019   ALBUMIN 4.1 12/30/2019   CALCIUM 10.0 12/30/2019   Lab Results  Component Value Date   CHOL 170 12/30/2019   Lab Results  Component Value Date   HDL 53 12/30/2019   Lab Results  Component Value Date   LDLCALC 91 12/30/2019   Lab Results  Component Value Date   TRIG 149 12/30/2019   Lab Results  Component Value Date   CHOLHDL 3.2 12/30/2019   Lab Results  Component Value Date   HGBA1C 5.8 (H) 12/30/2019      Assessment & Plan:   GERD: Plan of care was formulated today for GERD.  She is doing well.  A plan of care was formulated using patient exam, tests and other sources to optimize care using evidence based information.  Recommend no smoking, no eating after supper, avoid fatty foods, elevate Head of bed, avoid tight fitting clothing.  Continue on OTC  Hyperlipidemia: AN INDIVIDUAL CARE PLAN for hyperlipidemia/ cholesterol was established and reinforced today.  The patient's status was assessed using clinical findings on exam, lab and other diagnostic tests. The patient's disease status was assessed based on evidence-based guidelines and found to be well controlled. MEDICATIONS were reviewed. SELF MANAGEMENT GOALS have been discussed and patient's success at attaining the  goal of low cholesterol was assessed. RECOMMENDATION given include regular exercise 3 days a week and low cholesterol/low fat diet. CLINICAL SUMMARY including written plan to identify barriers unique to the patient due to social or economic  reasons was discussed.  Prediabetes: Patient has prediabetes but getting some numbness in legs, metformin started and paresthesia improved.  BMI 23 Patient is stable with this weight, I do not recommend andy changes.  UTI Patient having dysuria. Put on nitrofuradantoin  No orders of the defined types were placed in this encounter.   Return in about 4 months (around 08/28/2020) for fasting.   Reinaldo Meeker, MD

## 2020-04-29 LAB — LIPID PANEL
Chol/HDL Ratio: 2.9 ratio (ref 0.0–4.4)
Cholesterol, Total: 170 mg/dL (ref 100–199)
HDL: 58 mg/dL (ref >39)
LDL Chol Calc (NIH): 93 mg/dL (ref 0–99)
Triglycerides: 107 mg/dL (ref 0–149)
VLDL Cholesterol Cal: 19 mg/dL (ref 5–40)

## 2020-04-29 LAB — COMPREHENSIVE METABOLIC PANEL
ALT: 13 IU/L (ref 0–32)
AST: 14 IU/L (ref 0–40)
Albumin/Globulin Ratio: 1.9 (ref 1.2–2.2)
Albumin: 4.2 g/dL (ref 3.8–4.8)
Alkaline Phosphatase: 91 IU/L (ref 48–121)
BUN/Creatinine Ratio: 28 (ref 12–28)
BUN: 21 mg/dL (ref 8–27)
Bilirubin Total: <0.2 mg/dL (ref 0.0–1.2)
CO2: 25 mmol/L (ref 20–29)
Calcium: 9.7 mg/dL (ref 8.7–10.3)
Chloride: 102 mmol/L (ref 96–106)
Creatinine, Ser: 0.75 mg/dL (ref 0.57–1.00)
GFR calc Af Amer: 94 mL/min/{1.73_m2} (ref >59)
GFR calc non Af Amer: 82 mL/min/{1.73_m2} (ref >59)
Globulin, Total: 2.2 g/dL (ref 1.5–4.5)
Glucose: 96 mg/dL (ref 65–99)
Potassium: 4.8 mmol/L (ref 3.5–5.2)
Sodium: 139 mmol/L (ref 134–144)
Total Protein: 6.4 g/dL (ref 6.0–8.5)

## 2020-04-29 LAB — CBC WITH DIFFERENTIAL/PLATELET
Basophils Absolute: 0.1 10*3/uL (ref 0.0–0.2)
Basos: 2 %
EOS (ABSOLUTE): 0.1 10*3/uL (ref 0.0–0.4)
Eos: 2 %
Hematocrit: 35.8 % (ref 34.0–46.6)
Hemoglobin: 11.8 g/dL (ref 11.1–15.9)
Immature Grans (Abs): 0 10*3/uL (ref 0.0–0.1)
Immature Granulocytes: 0 %
Lymphocytes Absolute: 2 10*3/uL (ref 0.7–3.1)
Lymphs: 32 %
MCH: 29.9 pg (ref 26.6–33.0)
MCHC: 33 g/dL (ref 31.5–35.7)
MCV: 91 fL (ref 79–97)
Monocytes Absolute: 0.4 10*3/uL (ref 0.1–0.9)
Monocytes: 7 %
Neutrophils Absolute: 3.5 10*3/uL (ref 1.4–7.0)
Neutrophils: 57 %
Platelets: 335 10*3/uL (ref 150–450)
RBC: 3.94 x10E6/uL (ref 3.77–5.28)
RDW: 12.5 % (ref 11.7–15.4)
WBC: 6.2 10*3/uL (ref 3.4–10.8)

## 2020-04-29 LAB — CARDIOVASCULAR RISK ASSESSMENT

## 2020-04-29 LAB — HEMOGLOBIN A1C
Est. average glucose Bld gHb Est-mCnc: 114 mg/dL
Hgb A1c MFr Bld: 5.6 % (ref 4.8–5.6)

## 2020-04-29 NOTE — Progress Notes (Signed)
CBC normal, kidney and liver tests normal, Cholesterol normal, A1c 5.8 lp

## 2020-05-01 LAB — URINE CULTURE

## 2020-05-02 NOTE — Progress Notes (Signed)
Urine culture positive for E. Coli, sensitive to macrodantin lp

## 2020-07-21 ENCOUNTER — Other Ambulatory Visit: Payer: Self-pay | Admitting: Legal Medicine

## 2020-07-28 ENCOUNTER — Other Ambulatory Visit: Payer: Self-pay | Admitting: Legal Medicine

## 2020-07-28 ENCOUNTER — Telehealth: Payer: Self-pay

## 2020-07-28 ENCOUNTER — Other Ambulatory Visit: Payer: Self-pay

## 2020-07-28 MED ORDER — ZOLPIDEM TARTRATE 10 MG PO TABS: 10.0000 mg | ORAL_TABLET | Freq: Every evening | ORAL | 2 refills | Status: DC | PRN

## 2020-08-26 ENCOUNTER — Ambulatory Visit: Payer: PPO | Admitting: Legal Medicine

## 2020-08-31 ENCOUNTER — Encounter: Payer: Self-pay | Admitting: Legal Medicine

## 2020-08-31 ENCOUNTER — Ambulatory Visit (INDEPENDENT_AMBULATORY_CARE_PROVIDER_SITE_OTHER): Payer: PPO | Admitting: Legal Medicine

## 2020-08-31 ENCOUNTER — Other Ambulatory Visit: Payer: Self-pay

## 2020-08-31 VITALS — BP 128/68 | HR 74 | Temp 97.3°F | Ht 66.0 in | Wt 142.0 lb

## 2020-08-31 DIAGNOSIS — Z23 Encounter for immunization: Secondary | ICD-10-CM

## 2020-08-31 DIAGNOSIS — K2100 Gastro-esophageal reflux disease with esophagitis, without bleeding: Secondary | ICD-10-CM

## 2020-08-31 DIAGNOSIS — E782 Mixed hyperlipidemia: Secondary | ICD-10-CM | POA: Diagnosis not present

## 2020-08-31 DIAGNOSIS — R7303 Prediabetes: Secondary | ICD-10-CM | POA: Diagnosis not present

## 2020-08-31 DIAGNOSIS — M109 Gout, unspecified: Secondary | ICD-10-CM | POA: Diagnosis not present

## 2020-08-31 LAB — POCT UA - MICROALBUMIN: Microalbumin Ur, POC: 30 mg/L

## 2020-08-31 NOTE — Progress Notes (Signed)
Subjective:  Patient ID: Catherine Stokes, female    DOB: [birth year]  Age: 69 y.o. MRN: [id number]  Chief Complaint  Patient presents with  . Diabetes  . Hyperlipidemia    HPI: chronic visit  Prediabetes she is on metformin and doing well  Patient presents with hyperlipidemia.  Compliance with treatment has been good; patient takes medicines as directed, maintains low cholesterol diet, follows up as directed, and maintains exercise regimen.  Patient is using pravastatin without problems.  Patient has withgastroesophageal reflux symptoms withesophagitis and LTRD.  The symptoms are mild intensity.  Length of symptoms 10 years.  Medicines include OTC.  Complications include none.   Current Outpatient Medications on File Prior to Visit  Medication Sig Dispense Refill  . metFORMIN (GLUCOPHAGE) 500 MG tablet Take 500 mg by mouth 2 (two) times daily with a meal.    . pravastatin (PRAVACHOL) 40 MG tablet TAKE 1 TABLET BY MOUTH ONCE DAILY. 90 tablet 2  . zolpidem (AMBIEN) 10 MG tablet Take 1 tablet (10 mg total) by mouth at bedtime as needed. 30 tablet 2   No current facility-administered medications on file prior to visit.   Past Medical History:  Diagnosis Date  . Prediabetes    Past Surgical History:  Procedure Laterality Date  . CHOLECYSTECTOMY  07/24/2012   Procedure: LAPAROSCOPIC CHOLECYSTECTOMY WITH INTRAOPERATIVE CHOLANGIOGRAM;  Surgeon: Pedro Earls, MD;  Location: [facility];  Service: General;  Laterality: N/A;  . HERNIA REPAIR  [id number]   umbilical  . TUBAL LIGATION  1980    Family History  Problem Relation Age of Onset  . Diabetes Mother   . Heart disease Mother    Social History   Socioeconomic History  . Marital status: Married    Spouse name: Not on file  . Number of children: Not on file  . Years of education: Not on file  . Highest education level: Not on file  Occupational History  . Occupation: Farm  Tobacco Use  . Smoking status: Never Smoker  .  Smokeless tobacco: Never Used  Substance and Sexual Activity  . Alcohol use: No  . Drug use: No  . Sexual activity: Not Currently  Other Topics Concern  . Not on file  Social History Narrative  . Not on file   Social Determinants of Health   Financial Resource Strain:   . Difficulty of Paying Living Expenses: Not on file  Food Insecurity:   . Worried About Charity fundraiser in the Last Year: Not on file  . Ran Out of Food in the Last Year: Not on file  Transportation Needs:   . Lack of Transportation (Medical): Not on file  . Lack of Transportation (Non-Medical): Not on file  Physical Activity:   . Days of Exercise per Week: Not on file  . Minutes of Exercise per Session: Not on file  Stress:   . Feeling of Stress : Not on file  Social Connections:   . Frequency of Communication with Friends and Family: Not on file  . Frequency of Social Gatherings with Friends and Family: Not on file  . Attends Religious Services: Not on file  . Active Member of Clubs or Organizations: Not on file  . Attends Archivist Meetings: Not on file  . Marital Status: Not on file    Review of Systems  Constitutional: Negative for chills, fatigue and fever.  HENT: Positive for sinus pressure and sinus pain. Negative for congestion, ear pain, postnasal drip,  rhinorrhea and sore throat.   Respiratory: Negative for cough and shortness of breath.   Cardiovascular: Negative for chest pain.  Gastrointestinal: Positive for constipation. Negative for abdominal pain, diarrhea and nausea.  Genitourinary: Negative for difficulty urinating and urgency.  Musculoskeletal: Negative for arthralgias and myalgias.  Neurological: Positive for headaches. Negative for dizziness.  Psychiatric/Behavioral: Negative for dysphoric mood. The patient is not nervous/anxious.      Objective:  BP 128/68   Pulse 74   Temp (!) 97.3 F (36.3 C)   Ht 5\' 6"  (1.676 m)   Wt 142 lb (64.4 kg)   SpO2 99%   BMI 22.92  kg/m   BP/Weight 08/31/2020 02/27/3535 11/30/4313  Systolic BP 400 867 619  Diastolic BP 68 70 70  Wt. (Lbs) 142 145.6 145  BMI 22.92 23.5 23.4    Physical Exam Vitals reviewed.  Constitutional:      Appearance: Normal appearance.  HENT:     Head: Normocephalic and atraumatic.     Right Ear: Tympanic membrane, ear canal and external ear normal.     Left Ear: Tympanic membrane, ear canal and external ear normal.     Nose: Nose normal.     Mouth/Throat:     Mouth: Mucous membranes are moist.     Pharynx: Oropharynx is clear.  Eyes:     Extraocular Movements: Extraocular movements intact.     Conjunctiva/sclera: Conjunctivae normal.     Pupils: Pupils are equal, round, and reactive to light.  Cardiovascular:     Rate and Rhythm: Normal rate and regular rhythm.     Pulses: Normal pulses.     Heart sounds: Normal heart sounds.  Pulmonary:     Effort: Pulmonary effort is normal.     Breath sounds: Normal breath sounds.  Abdominal:     General: Abdomen is flat. Bowel sounds are normal.     Palpations: Abdomen is soft.  Musculoskeletal:        General: Normal range of motion.     Cervical back: Normal range of motion and neck supple.  Skin:    General: Skin is warm.     Capillary Refill: Capillary refill takes less than 2 seconds.  Neurological:     General: No focal deficit present.     Mental Status: She is alert.  Psychiatric:        Mood and Affect: Mood normal.        Thought Content: Thought content normal.     Diabetic Foot Exam - Simple   Simple Foot Form Diabetic Foot exam was performed with the following findings: Yes 08/31/2020  8:15 AM  Visual Inspection No deformities, no ulcerations, no other skin breakdown bilaterally: Yes Sensation Testing Intact to touch and monofilament testing bilaterally: Yes Pulse Check Posterior Tibialis and Dorsalis pulse intact bilaterally: Yes Comments      Lab Results  Component Value Date   WBC 6.2 04/28/2020   HGB 11.8  04/28/2020   HCT 35.8 04/28/2020   PLT 335 04/28/2020   GLUCOSE 96 04/28/2020   CHOL 170 04/28/2020   TRIG 107 04/28/2020   HDL 58 04/28/2020   LDLCALC 93 04/28/2020   ALT 13 04/28/2020   AST 14 04/28/2020   NA 139 04/28/2020   K 4.8 04/28/2020   CL 102 04/28/2020   CREATININE 0.75 04/28/2020   BUN 21 04/28/2020   CO2 25 04/28/2020   HGBA1C 5.6 04/28/2020   MICROALBUR 30 08/31/2020      Assessment & Plan:  1. Prediabetes - CBC with Differential/Platelet - Comprehensive metabolic panel - Hemoglobin A1c - POCT UA - Microalbumin Patient remains prediabetic but is on metformin since she is having some neuropathy  2. Mixed hyperlipidemia - Lipid panel AN INDIVIDUAL CARE PLAN for hyperlipidemia/ cholesterol was established and reinforced today.  The patient's status was assessed using clinical findings on exam, lab and other diagnostic tests. The patient's disease status was assessed based on evidence-based guidelines and found to be well controlled. MEDICATIONS were reviewed. SELF MANAGEMENT GOALS have been discussed and patient's success at attaining the goal of low cholesterol was assessed. RECOMMENDATION given include regular exercise 3 days a week and low cholesterol/low fat diet. CLINICAL SUMMARY including written plan to identify barriers unique to the patient due to social or economic  reasons was discussed.  3. Acute gout involving toe of left foot, unspecified cause - Uric Acid AN INDIVIDUAL CARE PLAN for gout was established and reinforced today.  The patient's status was assessed using clinical findings on exam, labs, and other diagnostic testing. Patient's success at meeting treatment goals based on disease specific evidence-bassed guidelines and found to be in good control. RECOMMENDATIONS include maintaining present medicines and treatment.  4. Need for immunization against influenza - Flu Vaccine QUAD High Dose(Fluad)  5. Gastroesophageal reflux disease with  esophagitis without hemorrhage Plan of care was formulated today.  She is doing well.  A plan of care was formulated using patient exam, tests and other sources to optimize care using evidence based information.  Recommend no smoking, no eating after supper, avoid fatty foods, elevate Head of bed, avoid tight fitting clothing.  Continue on otc.    No orders of the defined types were placed in this encounter.   Orders Placed This Encounter  Procedures  . Flu Vaccine QUAD High Dose(Fluad)  . CBC with Differential/Platelet  . Comprehensive metabolic panel  . Lipid panel  . Hemoglobin A1c  . Uric Acid  . POCT UA - Microalbumin     Follow-up: Return in about 4 months (around 01/01/2021) for fasting.  An After Visit Summary was printed and given to the patient.  [place] [phone]

## 2020-09-01 LAB — HEMOGLOBIN A1C
Est. average glucose Bld gHb Est-mCnc: 126 mg/dL
Hgb A1c MFr Bld: 6 % — ABNORMAL HIGH (ref 4.8–5.6)

## 2020-09-01 LAB — CBC WITH DIFFERENTIAL/PLATELET
Basophils Absolute: 0.1 10*3/uL (ref 0.0–0.2)
Basos: 2 %
EOS (ABSOLUTE): 0.1 10*3/uL (ref 0.0–0.4)
Eos: 2 %
Hematocrit: 36.1 % (ref 34.0–46.6)
Hemoglobin: 11.9 g/dL (ref 11.1–15.9)
Immature Grans (Abs): 0 10*3/uL (ref 0.0–0.1)
Immature Granulocytes: 0 %
Lymphocytes Absolute: 1.9 10*3/uL (ref 0.7–3.1)
Lymphs: 29 %
MCH: 30.2 pg (ref 26.6–33.0)
MCHC: 33 g/dL (ref 31.5–35.7)
MCV: 92 fL (ref 79–97)
Monocytes Absolute: 0.4 10*3/uL (ref 0.1–0.9)
Monocytes: 7 %
Neutrophils Absolute: 4.1 10*3/uL (ref 1.4–7.0)
Neutrophils: 60 %
Platelets: 333 10*3/uL (ref 150–450)
RBC: 3.94 x10E6/uL (ref 3.77–5.28)
RDW: 12.3 % (ref 11.7–15.4)
WBC: 6.7 10*3/uL (ref 3.4–10.8)

## 2020-09-01 LAB — LIPID PANEL
Chol/HDL Ratio: 2.5 ratio (ref 0.0–4.4)
Cholesterol, Total: 157 mg/dL (ref 100–199)
HDL: 63 mg/dL (ref >39)
LDL Chol Calc (NIH): 78 mg/dL (ref 0–99)
Triglycerides: 84 mg/dL (ref 0–149)
VLDL Cholesterol Cal: 16 mg/dL (ref 5–40)

## 2020-09-01 LAB — COMPREHENSIVE METABOLIC PANEL
ALT: 12 IU/L (ref 0–32)
AST: 15 IU/L (ref 0–40)
Albumin/Globulin Ratio: 1.8 (ref 1.2–2.2)
Albumin: 4.4 g/dL (ref 3.8–4.8)
Alkaline Phosphatase: 92 IU/L (ref 44–121)
BUN/Creatinine Ratio: 23 (ref 12–28)
BUN: 16 mg/dL (ref 8–27)
Bilirubin Total: 0.3 mg/dL (ref 0.0–1.2)
CO2: 26 mmol/L (ref 20–29)
Calcium: 10.1 mg/dL (ref 8.7–10.3)
Chloride: 100 mmol/L (ref 96–106)
Creatinine, Ser: 0.7 mg/dL (ref 0.57–1.00)
GFR calc Af Amer: 102 mL/min/{1.73_m2} (ref >59)
GFR calc non Af Amer: 89 mL/min/{1.73_m2} (ref >59)
Globulin, Total: 2.4 g/dL (ref 1.5–4.5)
Glucose: 90 mg/dL (ref 65–99)
Potassium: 4.5 mmol/L (ref 3.5–5.2)
Sodium: 139 mmol/L (ref 134–144)
Total Protein: 6.8 g/dL (ref 6.0–8.5)

## 2020-09-01 LAB — CARDIOVASCULAR RISK ASSESSMENT

## 2020-09-01 LAB — URIC ACID: Uric Acid: 4.6 mg/dL (ref 3.0–7.2)

## 2020-09-01 NOTE — Progress Notes (Signed)
Uric acid 4.6 good level.not in gouty area lp

## 2020-09-13 ENCOUNTER — Other Ambulatory Visit: Payer: Self-pay

## 2020-09-13 ENCOUNTER — Ambulatory Visit (INDEPENDENT_AMBULATORY_CARE_PROVIDER_SITE_OTHER): Payer: PPO | Admitting: Legal Medicine

## 2020-09-13 ENCOUNTER — Encounter: Payer: Self-pay | Admitting: Legal Medicine

## 2020-09-13 VITALS — BP 100/60 | HR 71 | Temp 97.7°F | Resp 16 | Ht 66.0 in | Wt 144.0 lb

## 2020-09-13 DIAGNOSIS — L2489 Irritant contact dermatitis due to other agents: Secondary | ICD-10-CM | POA: Diagnosis not present

## 2020-09-13 MED ORDER — TRIAMCINOLONE ACETONIDE 40 MG/ML IJ SUSP
80.0000 mg | Freq: Once | INTRAMUSCULAR | Status: AC
  Administered 2020-09-13: 80 mg via INTRAMUSCULAR

## 2020-09-13 NOTE — Progress Notes (Signed)
Subjective:  Patient ID: Catherine Stokes, female    DOB: July 21, 1951  Age: 69 y.o. MRN: [id number]  Chief Complaint  Patient presents with  . Rash    Patient has rash on both arms and both legs. Rash is itching and redness.    HPI: rash on arms for one week.  It is pruritic.  Small bites on ankles and 1cm patches of redness on forearms that are pruritic.  No vesicles.  She does not know of anything she used or touched in yard.   Current Outpatient Medications on File Prior to Visit  Medication Sig Dispense Refill  . metFORMIN (GLUCOPHAGE) 500 MG tablet Take 500 mg by mouth 2 (two) times daily with a meal.    . pravastatin (PRAVACHOL) 40 MG tablet TAKE 1 TABLET BY MOUTH ONCE DAILY. 90 tablet 2  . zolpidem (AMBIEN) 10 MG tablet Take 1 tablet (10 mg total) by mouth at bedtime as needed. 30 tablet 2   No current facility-administered medications on file prior to visit.   Past Medical History:  Diagnosis Date  . Prediabetes    Past Surgical History:  Procedure Laterality Date  . CHOLECYSTECTOMY  07/24/2012   Procedure: LAPAROSCOPIC CHOLECYSTECTOMY WITH INTRAOPERATIVE CHOLANGIOGRAM;  Surgeon: Pedro Earls, MD;  Location: [facility];  Service: General;  Laterality: N/A;  . HERNIA REPAIR  [id number]   umbilical  . TUBAL LIGATION  1980    Family History  Problem Relation Age of Onset  . Diabetes Mother   . Heart disease Mother    Social History   Socioeconomic History  . Marital status: Married    Spouse name: Not on file  . Number of children: Not on file  . Years of education: Not on file  . Highest education level: Not on file  Occupational History  . Occupation: Farm  Tobacco Use  . Smoking status: Never Smoker  . Smokeless tobacco: Never Used  Substance and Sexual Activity  . Alcohol use: No  . Drug use: No  . Sexual activity: Not Currently  Other Topics Concern  . Not on file  Social History Narrative  . Not on file   Social Determinants of Health   Financial  Resource Strain:   . Difficulty of Paying Living Expenses: Not on file  Food Insecurity:   . Worried About Charity fundraiser in the Last Year: Not on file  . Ran Out of Food in the Last Year: Not on file  Transportation Needs:   . Lack of Transportation (Medical): Not on file  . Lack of Transportation (Non-Medical): Not on file  Physical Activity:   . Days of Exercise per Week: Not on file  . Minutes of Exercise per Session: Not on file  Stress:   . Feeling of Stress : Not on file  Social Connections:   . Frequency of Communication with Friends and Family: Not on file  . Frequency of Social Gatherings with Friends and Family: Not on file  . Attends Religious Services: Not on file  . Active Member of Clubs or Organizations: Not on file  . Attends Archivist Meetings: Not on file  . Marital Status: Not on file    Review of Systems  Constitutional: Negative.   HENT: Negative.   Respiratory: Negative.  Negative for cough and shortness of breath.   Cardiovascular: Negative.  Negative for chest pain and palpitations.  Genitourinary: Negative.   Musculoskeletal: Negative.   Skin: Positive for rash.  Neurological:  Negative.   Psychiatric/Behavioral: Negative.      Objective:  BP 100/60   Pulse 71   Temp 97.7 F (36.5 C)   Resp 16   Ht 5\' 6"  (1.676 m)   Wt 144 lb (65.3 kg)   SpO2 97%   BMI 23.24 kg/m   BP/Weight 09/13/2020 17/05/9389 3/0/0923  Systolic BP 300 762 263  Diastolic BP 60 68 70  Wt. (Lbs) 144 142 145.6  BMI 23.24 22.92 23.5    Physical Exam Vitals reviewed.  Constitutional:      Appearance: Normal appearance.  HENT:     Head: Normocephalic and atraumatic.  Eyes:     Extraocular Movements: Extraocular movements intact.     Conjunctiva/sclera: Conjunctivae normal.     Pupils: Pupils are equal, round, and reactive to light.  Cardiovascular:     Rate and Rhythm: Normal rate and regular rhythm.     Pulses: Normal pulses.     Heart sounds:  Normal heart sounds.  Pulmonary:     Breath sounds: Normal breath sounds.  Musculoskeletal:        General: Normal range of motion.  Skin:    Findings: Erythema and lesion present.     Comments: Punctate wounds on ankles with dry skin and 1cm patches or redness on forarms= all pruritic.  Neurological:     General: No focal deficit present.     Mental Status: She is alert and oriented to person, place, and time.       Lab Results  Component Value Date   WBC 6.7 08/31/2020   HGB 11.9 08/31/2020   HCT 36.1 08/31/2020   PLT 333 08/31/2020   GLUCOSE 90 08/31/2020   CHOL 157 08/31/2020   TRIG 84 08/31/2020   HDL 63 08/31/2020   LDLCALC 78 08/31/2020   ALT 12 08/31/2020   AST 15 08/31/2020   NA 139 08/31/2020   K 4.5 08/31/2020   CL 100 08/31/2020   CREATININE 0.70 08/31/2020   BUN 16 08/31/2020   CO2 26 08/31/2020   HGBA1C 6.0 (H) 08/31/2020   MICROALBUR 30 08/31/2020      Assessment & Plan:   1. Irritant contact dermatitis due to other agents - triamcinolone acetonide (KENALOG-40) injection 80 mg Patient give kenalog injection and is to use moistuizing cream on dry skin   Meds ordered this encounter  Medications  . triamcinolone acetonide (KENALOG-40) injection 80 mg       Follow-up: Return if symptoms worsen or fail to improve.  An After Visit Summary was printed and given to the patient.  [place] [phone]

## 2020-09-23 ENCOUNTER — Other Ambulatory Visit: Payer: Self-pay | Admitting: Legal Medicine

## 2020-09-30 ENCOUNTER — Other Ambulatory Visit: Payer: Self-pay | Admitting: Legal Medicine

## 2020-09-30 DIAGNOSIS — E782 Mixed hyperlipidemia: Secondary | ICD-10-CM

## 2020-11-03 DIAGNOSIS — Z20828 Contact with and (suspected) exposure to other viral communicable diseases: Secondary | ICD-10-CM | POA: Diagnosis not present

## 2020-11-03 DIAGNOSIS — J3489 Other specified disorders of nose and nasal sinuses: Secondary | ICD-10-CM | POA: Diagnosis not present

## 2020-11-11 DIAGNOSIS — Z01419 Encounter for gynecological examination (general) (routine) without abnormal findings: Secondary | ICD-10-CM | POA: Diagnosis not present

## 2020-12-08 DIAGNOSIS — Z1231 Encounter for screening mammogram for malignant neoplasm of breast: Secondary | ICD-10-CM | POA: Diagnosis not present

## 2020-12-11 ENCOUNTER — Other Ambulatory Visit: Payer: Self-pay | Admitting: Obstetrics and Gynecology

## 2020-12-11 DIAGNOSIS — E2839 Other primary ovarian failure: Secondary | ICD-10-CM

## 2021-01-03 ENCOUNTER — Ambulatory Visit: Payer: PPO | Admitting: Legal Medicine

## 2021-01-04 DIAGNOSIS — H524 Presbyopia: Secondary | ICD-10-CM | POA: Diagnosis not present

## 2021-01-13 ENCOUNTER — Other Ambulatory Visit: Payer: Self-pay

## 2021-01-13 ENCOUNTER — Ambulatory Visit (INDEPENDENT_AMBULATORY_CARE_PROVIDER_SITE_OTHER): Payer: PPO | Admitting: Legal Medicine

## 2021-01-13 ENCOUNTER — Encounter: Payer: Self-pay | Admitting: Legal Medicine

## 2021-01-13 VITALS — BP 124/80 | HR 70 | Temp 97.6°F | Resp 16 | Ht 66.0 in | Wt 136.0 lb

## 2021-01-13 DIAGNOSIS — E782 Mixed hyperlipidemia: Secondary | ICD-10-CM | POA: Diagnosis not present

## 2021-01-13 DIAGNOSIS — F5102 Adjustment insomnia: Secondary | ICD-10-CM | POA: Diagnosis not present

## 2021-01-13 DIAGNOSIS — R300 Dysuria: Secondary | ICD-10-CM

## 2021-01-13 DIAGNOSIS — K2100 Gastro-esophageal reflux disease with esophagitis, without bleeding: Secondary | ICD-10-CM | POA: Diagnosis not present

## 2021-01-13 DIAGNOSIS — R7303 Prediabetes: Secondary | ICD-10-CM

## 2021-01-13 DIAGNOSIS — K5909 Other constipation: Secondary | ICD-10-CM

## 2021-01-13 DIAGNOSIS — M8588 Other specified disorders of bone density and structure, other site: Secondary | ICD-10-CM

## 2021-01-13 LAB — POCT URINALYSIS DIP (CLINITEK)
Bilirubin, UA: NEGATIVE
Glucose, UA: NEGATIVE mg/dL
Ketones, POC UA: NEGATIVE mg/dL
Nitrite, UA: NEGATIVE
Spec Grav, UA: 1.025 (ref 1.010–1.025)
Urobilinogen, UA: 0.2 E.U./dL
pH, UA: 6 (ref 5.0–8.0)

## 2021-01-13 MED ORDER — SULFAMETHOXAZOLE-TRIMETHOPRIM 800-160 MG PO TABS: 1.0000 | ORAL_TABLET | Freq: Two times a day (BID) | ORAL | 0 refills | Status: DC

## 2021-01-13 NOTE — Progress Notes (Signed)
Subjective:  Patient ID: Catherine Stokes, female    DOB: 04/08/1951  Age: 70 y.o. MRN: [id number]  Chief Complaint  Patient presents with  . Prediabetes  . Hyperlipidemia  . Urinary Tract Infection    HPI: chronic visit  Prediabetes that is stable.  Watching diet. Metformin.  Patient presents with hyperlipidemia.  Compliance with treatment has been good; patient takes medicines as directed, maintains low cholesterol diet, follows up as directed, and maintains exercise regimen.  Patient is using pravastatin without problems.   Current Outpatient Medications on File Prior to Visit  Medication Sig Dispense Refill  . metFORMIN (GLUCOPHAGE) 500 MG tablet TAKE 1 TABLET BY MOUTH TWICE DAILY 180 tablet 2  . pravastatin (PRAVACHOL) 40 MG tablet TAKE 1 TABLET BY MOUTH ONCE DAILY. 90 tablet 2  . zolpidem (AMBIEN) 10 MG tablet Take 1 tablet (10 mg total) by mouth at bedtime as needed. 30 tablet 2   No current facility-administered medications on file prior to visit.   Past Medical History:  Diagnosis Date  . Prediabetes    Past Surgical History:  Procedure Laterality Date  . CHOLECYSTECTOMY  07/24/2012   Procedure: LAPAROSCOPIC CHOLECYSTECTOMY WITH INTRAOPERATIVE CHOLANGIOGRAM;  Surgeon: Pedro Earls, MD;  Location: [facility];  Service: General;  Laterality: N/A;  . HERNIA REPAIR  [id number]   umbilical  . TUBAL LIGATION  1980    Family History  Problem Relation Age of Onset  . Diabetes Mother   . Heart disease Mother    Social History   Socioeconomic History  . Marital status: Married    Spouse name: Not on file  . Number of children: Not on file  . Years of education: Not on file  . Highest education level: Not on file  Occupational History  . Occupation: Farm  Tobacco Use  . Smoking status: Never Smoker  . Smokeless tobacco: Never Used  Substance and Sexual Activity  . Alcohol use: No  . Drug use: No  . Sexual activity: Not Currently  Other Topics Concern  . Not on  file  Social History Narrative  . Not on file   Social Determinants of Health   Financial Resource Strain: Not on file  Food Insecurity: Not on file  Transportation Needs: Not on file  Physical Activity: Not on file  Stress: Not on file  Social Connections: Not on file    Review of Systems  Constitutional: Negative for activity change, appetite change and fever.  HENT: Negative for congestion and sinus pain.   Eyes: Negative for visual disturbance.  Respiratory: Negative for apnea, choking and shortness of breath.   Cardiovascular: Negative for chest pain, palpitations and leg swelling.  Gastrointestinal: Negative for abdominal distention and abdominal pain.  Endocrine: Negative for polyuria.  Genitourinary: Negative.  Negative for difficulty urinating, dysuria and urgency.  Musculoskeletal: Negative for arthralgias and back pain.  Skin: Negative.   Neurological: Negative.   Psychiatric/Behavioral: Negative.      Objective:  BP 124/80   Pulse 70   Temp 97.6 F (36.4 C)   Resp 16   Ht 5\' 6"  (1.676 m)   Wt 136 lb (61.7 kg)   SpO2 98%   BMI 21.95 kg/m   BP/Weight 01/13/2021 09/13/2020 77/06/2422  Systolic BP 536 144 315  Diastolic BP 80 60 68  Wt. (Lbs) 136 144 142  BMI 21.95 23.24 22.92    Physical Exam Vitals reviewed.  Constitutional:      Appearance: Normal appearance.  HENT:  Head: Normocephalic.     Right Ear: Tympanic membrane, ear canal and external ear normal.     Nose: Nose normal.     Mouth/Throat:     Mouth: Mucous membranes are moist.     Pharynx: Oropharynx is clear.  Eyes:     Conjunctiva/sclera: Conjunctivae normal.     Pupils: Pupils are equal, round, and reactive to light.  Cardiovascular:     Rate and Rhythm: Normal rate and regular rhythm.     Pulses: Normal pulses.     Heart sounds: Normal heart sounds. No murmur heard. No gallop.   Pulmonary:     Effort: Pulmonary effort is normal. No respiratory distress.     Breath sounds:  Normal breath sounds. No rales.  Abdominal:     General: Abdomen is flat. Bowel sounds are normal. There is no distension.     Palpations: Abdomen is soft.     Tenderness: There is no abdominal tenderness.  Musculoskeletal:        General: Normal range of motion.     Cervical back: Normal range of motion and neck supple.  Skin:    General: Skin is warm.     Capillary Refill: Capillary refill takes less than 2 seconds.  Neurological:     General: No focal deficit present.     Mental Status: She is alert and oriented to person, place, and time. Mental status is at baseline.  Psychiatric:        Mood and Affect: Mood normal.        Thought Content: Thought content normal.        Judgment: Judgment normal.       Lab Results  Component Value Date   WBC 6.7 08/31/2020   HGB 11.9 08/31/2020   HCT 36.1 08/31/2020   PLT 333 08/31/2020   GLUCOSE 90 08/31/2020   CHOL 157 08/31/2020   TRIG 84 08/31/2020   HDL 63 08/31/2020   LDLCALC 78 08/31/2020   ALT 12 08/31/2020   AST 15 08/31/2020   NA 139 08/31/2020   K 4.5 08/31/2020   CL 100 08/31/2020   CREATININE 0.70 08/31/2020   BUN 16 08/31/2020   CO2 26 08/31/2020   HGBA1C 6.0 (H) 08/31/2020   MICROALBUR 30 08/31/2020      Assessment & Plan:   Diagnoses and all orders for this visit: Prediabetes -     Comprehensive metabolic panel -     Hemoglobin A1c Patient has prediabetes and is doing well on diet  Mixed hyperlipidemia -     Lipid panel AN INDIVIDUAL CARE PLAN for hyperlipidemia/ cholesterol was established and reinforced today.  The patient's status was assessed using clinical findings on exam, lab and other diagnostic tests. The patient's disease status was assessed based on evidence-based guidelines and found to be well controlled. MEDICATIONS were reviewed. SELF MANAGEMENT GOALS have been discussed and patient's success at attaining the goal of low cholesterol was assessed. RECOMMENDATION given include regular  exercise 3 days a week and low cholesterol/low fat diet. CLINICAL SUMMARY including written plan to identify barriers unique to the patient due to social or economic  reasons was discussed.  Gastroesophageal reflux disease with esophagitis without hemorrhage -     CBC with Differential/Platelet Plan of care was formulated today.  She is doing well.  A plan of care was formulated using patient exam, tests and other sources to optimize care using evidence based information.  Recommend no smoking, no eating after supper, avoid  fatty foods, elevate Head of bed, avoid tight fitting clothing.  Continue on OTC . 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 -     Urine Culture Patient has UTI     Orders Placed This Encounter  Procedures  . Urine Culture  . Comprehensive metabolic panel  . Hemoglobin A1c  . Lipid panel  . CBC with Differential/Platelet  . POCT URINALYSIS DIP (CLINITEK)      I spent 11minutes dedicated to the care of this patient on the date of this encounter to include face-to-face time with the patient, as well NL:GXQJJHER records  Follow-up: Return in about 4 months (around 05/13/2021) for fasting.  An After Visit Summary was printed and given to the patient.  Reinaldo Meeker, MD [facility] [phone]

## 2021-01-14 LAB — COMPREHENSIVE METABOLIC PANEL
ALT: 13 IU/L (ref 0–32)
AST: 16 IU/L (ref 0–40)
Albumin/Globulin Ratio: 1.8 (ref 1.2–2.2)
Albumin: 4.2 g/dL (ref 3.8–4.8)
Alkaline Phosphatase: 86 IU/L (ref 44–121)
BUN/Creatinine Ratio: 21 (ref 12–28)
BUN: 16 mg/dL (ref 8–27)
Bilirubin Total: 0.3 mg/dL (ref 0.0–1.2)
CO2: 21 mmol/L (ref 20–29)
Calcium: 9.8 mg/dL (ref 8.7–10.3)
Chloride: 103 mmol/L (ref 96–106)
Creatinine, Ser: 0.77 mg/dL (ref 0.57–1.00)
GFR calc Af Amer: 90 mL/min/{1.73_m2} (ref >59)
GFR calc non Af Amer: 78 mL/min/{1.73_m2} (ref >59)
Globulin, Total: 2.3 g/dL (ref 1.5–4.5)
Glucose: 89 mg/dL (ref 65–99)
Potassium: 4.6 mmol/L (ref 3.5–5.2)
Sodium: 140 mmol/L (ref 134–144)
Total Protein: 6.5 g/dL (ref 6.0–8.5)

## 2021-01-14 LAB — CBC WITH DIFFERENTIAL/PLATELET
Basophils Absolute: 0.1 10*3/uL (ref 0.0–0.2)
Basos: 1 %
EOS (ABSOLUTE): 0.1 10*3/uL (ref 0.0–0.4)
Eos: 1 %
Hematocrit: 36.8 % (ref 34.0–46.6)
Hemoglobin: 11.7 g/dL (ref 11.1–15.9)
Immature Grans (Abs): 0 10*3/uL (ref 0.0–0.1)
Immature Granulocytes: 0 %
Lymphocytes Absolute: 1.9 10*3/uL (ref 0.7–3.1)
Lymphs: 17 %
MCH: 28.8 pg (ref 26.6–33.0)
MCHC: 31.8 g/dL (ref 31.5–35.7)
MCV: 91 fL (ref 79–97)
Monocytes Absolute: 0.6 10*3/uL (ref 0.1–0.9)
Monocytes: 5 %
Neutrophils Absolute: 8.2 10*3/uL — ABNORMAL HIGH (ref 1.4–7.0)
Neutrophils: 76 %
Platelets: 357 10*3/uL (ref 150–450)
RBC: 4.06 x10E6/uL (ref 3.77–5.28)
RDW: 12.3 % (ref 11.7–15.4)
WBC: 10.9 10*3/uL — ABNORMAL HIGH (ref 3.4–10.8)

## 2021-01-14 LAB — LIPID PANEL
Chol/HDL Ratio: 2.6 ratio (ref 0.0–4.4)
Cholesterol, Total: 153 mg/dL (ref 100–199)
HDL: 59 mg/dL (ref >39)
LDL Chol Calc (NIH): 76 mg/dL (ref 0–99)
Triglycerides: 96 mg/dL (ref 0–149)
VLDL Cholesterol Cal: 18 mg/dL (ref 5–40)

## 2021-01-14 LAB — HEMOGLOBIN A1C
Est. average glucose Bld gHb Est-mCnc: 117 mg/dL
Hgb A1c MFr Bld: 5.7 % — ABNORMAL HIGH (ref 4.8–5.6)

## 2021-01-14 LAB — CARDIOVASCULAR RISK ASSESSMENT

## 2021-01-14 NOTE — Progress Notes (Signed)
Kidney and liver tests normal, A1c 5.7, Cholesterol is normal, wbc 10.9 and infection lp

## 2021-01-15 LAB — URINE CULTURE

## 2021-01-16 NOTE — Progress Notes (Signed)
Urine culture E. Coli, sensitive to Bactrim lp

## 2021-01-28 ENCOUNTER — Ambulatory Visit (INDEPENDENT_AMBULATORY_CARE_PROVIDER_SITE_OTHER): Payer: PPO | Admitting: Legal Medicine

## 2021-01-28 ENCOUNTER — Other Ambulatory Visit: Payer: Self-pay

## 2021-01-28 ENCOUNTER — Encounter: Payer: Self-pay | Admitting: Legal Medicine

## 2021-01-28 VITALS — BP 116/60 | HR 68 | Temp 97.4°F | Resp 16 | Ht 66.0 in | Wt 138.6 lb

## 2021-01-28 DIAGNOSIS — R109 Unspecified abdominal pain: Secondary | ICD-10-CM

## 2021-01-28 NOTE — Progress Notes (Signed)
Subjective:  Patient ID: Catherine Stokes, female    DOB: 02/01/1951  Age: 70 y.o. MRN: [id number]  Chief Complaint  Patient presents with  . Abdominal Pain    Patient states she is having pains in her stomach and she feels like its full or air, she has burped some but has not helped. Symptoms started 2 days ago. She has used Rolaids, omeprazole, and drank vinegar but no improvements.     HPI: abdominal bloating for 3 days.  Burping.  Eating less, some constipation.  No fever or chills.  Had cholecystectomy in 2013.  mylanta 30cc helped.   Current Outpatient Medications on File Prior to Visit  Medication Sig Dispense Refill  . metFORMIN (GLUCOPHAGE) 500 MG tablet TAKE 1 TABLET BY MOUTH TWICE DAILY 180 tablet 2  . pravastatin (PRAVACHOL) 40 MG tablet TAKE 1 TABLET BY MOUTH ONCE DAILY. 90 tablet 2  . zolpidem (AMBIEN) 10 MG tablet Take 1 tablet (10 mg total) by mouth at bedtime as needed. 30 tablet 2   No current facility-administered medications on file prior to visit.   Past Medical History:  Diagnosis Date  . Prediabetes    Past Surgical History:  Procedure Laterality Date  . CHOLECYSTECTOMY  07/24/2012   Procedure: LAPAROSCOPIC CHOLECYSTECTOMY WITH INTRAOPERATIVE CHOLANGIOGRAM;  Surgeon: Pedro Earls, MD;  Location: [facility];  Service: General;  Laterality: N/A;  . HERNIA REPAIR  [id number]   umbilical  . TUBAL LIGATION  1980    Family History  Problem Relation Age of Onset  . Diabetes Mother   . Heart disease Mother    Social History   Socioeconomic History  . Marital status: Married    Spouse name: Not on file  . Number of children: Not on file  . Years of education: Not on file  . Highest education level: Not on file  Occupational History  . Occupation: Farm  Tobacco Use  . Smoking status: Never Smoker  . Smokeless tobacco: Never Used  Substance and Sexual Activity  . Alcohol use: No  . Drug use: No  . Sexual activity: Not Currently  Other Topics Concern  .  Not on file  Social History Narrative  . Not on file   Social Determinants of Health   Financial Resource Strain: Not on file  Food Insecurity: Not on file  Transportation Needs: Not on file  Physical Activity: Not on file  Stress: Not on file  Social Connections: Not on file    Review of Systems  Constitutional: Negative for activity change and diaphoresis.  HENT: Negative for dental problem.   Eyes: Negative for visual disturbance.  Respiratory: Negative for chest tightness and shortness of breath.   Cardiovascular: Negative for chest pain, palpitations and leg swelling.  Gastrointestinal: Positive for abdominal distention.  Genitourinary: Negative.   Musculoskeletal: Negative for arthralgias and back pain.  Skin: Negative.   Neurological: Negative.   Psychiatric/Behavioral: Negative.      Objective:  BP 116/60 (BP Location: Right Arm, Patient Position: Sitting, Cuff Size: Normal)   Pulse 68   Temp (!) 97.4 F (36.3 C) (Temporal)   Resp 16   Ht 5\' 6"  (1.676 m)   Wt 138 lb 9.6 oz (62.9 kg)   SpO2 95%   BMI 22.37 kg/m   BP/Weight 01/28/2021 01/13/2021 75/91/6384  Systolic BP 665 993 570  Diastolic BP 60 80 60  Wt. (Lbs) 138.6 136 144  BMI 22.37 21.95 23.24    Physical Exam Vitals reviewed.  Constitutional:      Appearance: She is well-developed.  HENT:     Head: Normocephalic and atraumatic.     Mouth/Throat:     Mouth: Mucous membranes are moist.     Pharynx: Oropharynx is clear.  Eyes:     Extraocular Movements: Extraocular movements intact.  Cardiovascular:     Rate and Rhythm: Normal rate and regular rhythm.     Heart sounds: Normal heart sounds. No murmur heard. No gallop.   Pulmonary:     Effort: Pulmonary effort is normal. No respiratory distress.     Breath sounds: Normal breath sounds. No rales.  Abdominal:     General: Abdomen is flat. Bowel sounds are normal.     Palpations: Abdomen is soft.     Tenderness: There is generalized abdominal  tenderness. There is no right CVA tenderness, left CVA tenderness, guarding or rebound. Negative signs include Murphy's sign, Rovsing's sign, McBurney's sign, psoas sign and obturator sign.     Hernia: No hernia is present.  Neurological:     Mental Status: She is alert.   EKG: NSR rate 74, PR 154msec, QRS 27msec, QTc 475msec, axis 67 degress    Lab Results  Component Value Date   WBC 10.9 (H) 01/13/2021   HGB 11.7 01/13/2021   HCT 36.8 01/13/2021   PLT 357 01/13/2021   GLUCOSE 89 01/13/2021   CHOL 153 01/13/2021   TRIG 96 01/13/2021   HDL 59 01/13/2021   LDLCALC 76 01/13/2021   ALT 13 01/13/2021   AST 16 01/13/2021   NA 140 01/13/2021   K 4.6 01/13/2021   CL 103 01/13/2021   CREATININE 0.77 01/13/2021   BUN 16 01/13/2021   CO2 21 01/13/2021   HGBA1C 5.7 (H) 01/13/2021   MICROALBUR 30 08/31/2020      Assessment & Plan:   Diagnoses and all orders for this visit: Acute abdominal pain -     DG Abd 2 Views -     EKG 12-Lead abdominal pain, mylanta no help, abdominal x-rays normal, patient is to take magnesium citrate for constipation.        I spent 25 minutes dedicated to the care of this patient on the date of this encounter to include face-to-face time with the patient, as well as:   Follow-up: Return in about 1 week (around 02/04/2021).  An After Visit Summary was printed and given to the patient.  Reinaldo Meeker, MD [facility] [phone]

## 2021-01-30 DIAGNOSIS — R109 Unspecified abdominal pain: Secondary | ICD-10-CM | POA: Insufficient documentation

## 2021-02-10 ENCOUNTER — Other Ambulatory Visit: Payer: Self-pay

## 2021-02-10 MED ORDER — ZOLPIDEM TARTRATE 10 MG PO TABS: 10.0000 mg | ORAL_TABLET | Freq: Every evening | ORAL | 3 refills | Status: DC | PRN

## 2021-04-05 ENCOUNTER — Ambulatory Visit
Admission: RE | Admit: 2021-04-05 | Discharge: 2021-04-05 | Disposition: A | Discharge status: Home / Self Care | Payer: PPO | Source: Ambulatory Visit | Attending: Obstetrics and Gynecology | Admitting: Obstetrics and Gynecology

## 2021-04-05 ENCOUNTER — Other Ambulatory Visit: Payer: Self-pay

## 2021-04-05 DIAGNOSIS — Z780 Asymptomatic menopausal state: Secondary | ICD-10-CM | POA: Diagnosis not present

## 2021-04-05 DIAGNOSIS — E2839 Other primary ovarian failure: Secondary | ICD-10-CM

## 2021-04-05 DIAGNOSIS — M8589 Other specified disorders of bone density and structure, multiple sites: Secondary | ICD-10-CM | POA: Diagnosis not present

## 2021-04-14 DIAGNOSIS — L851 Acquired keratosis [keratoderma] palmaris et plantaris: Secondary | ICD-10-CM | POA: Diagnosis not present

## 2021-04-14 DIAGNOSIS — B351 Tinea unguium: Secondary | ICD-10-CM | POA: Diagnosis not present

## 2021-04-15 DIAGNOSIS — B351 Tinea unguium: Secondary | ICD-10-CM | POA: Insufficient documentation

## 2021-04-15 DIAGNOSIS — L851 Acquired keratosis [keratoderma] palmaris et plantaris: Secondary | ICD-10-CM | POA: Insufficient documentation

## 2021-05-13 ENCOUNTER — Encounter: Payer: Self-pay | Admitting: Legal Medicine

## 2021-05-13 ENCOUNTER — Ambulatory Visit (INDEPENDENT_AMBULATORY_CARE_PROVIDER_SITE_OTHER): Payer: PPO | Admitting: Legal Medicine

## 2021-05-13 ENCOUNTER — Other Ambulatory Visit: Payer: Self-pay

## 2021-05-13 VITALS — BP 120/60 | HR 67 | Temp 97.6°F | Resp 15 | Ht 66.0 in | Wt 133.0 lb

## 2021-05-13 DIAGNOSIS — M8588 Other specified disorders of bone density and structure, other site: Secondary | ICD-10-CM | POA: Diagnosis not present

## 2021-05-13 DIAGNOSIS — E782 Mixed hyperlipidemia: Secondary | ICD-10-CM

## 2021-05-13 DIAGNOSIS — R7303 Prediabetes: Secondary | ICD-10-CM

## 2021-05-13 DIAGNOSIS — F5102 Adjustment insomnia: Secondary | ICD-10-CM

## 2021-05-13 DIAGNOSIS — M1990 Unspecified osteoarthritis, unspecified site: Secondary | ICD-10-CM | POA: Diagnosis not present

## 2021-05-13 DIAGNOSIS — K2100 Gastro-esophageal reflux disease with esophagitis, without bleeding: Secondary | ICD-10-CM | POA: Diagnosis not present

## 2021-05-13 MED ORDER — MELOXICAM 15 MG PO TABS: 15.0000 mg | ORAL_TABLET | Freq: Every day | ORAL | 3 refills | Status: DC

## 2021-05-13 NOTE — Progress Notes (Signed)
Established Patient Office Visit  Subjective:  Patient ID: Catherine Stokes, female    DOB: [birth year]  Age: 70 y.o. MRN: [id number]  CC:  Chief Complaint  Patient presents with   Prediabetes   Gastroesophageal Reflux   Hyperlipidemia    HPI Catherine Stokes presents for chronic chronic  Patient presents with hyperlipidemia.  Compliance with treatment has been good; patient takes medicines as directed, maintains low cholesterol diet, follows up as directed, and maintains exercise regimen.  Patient is using pravastatin without problems.   Patient has gastroesophageal reflux symptoms withesophagitis and LTRD.  The symptoms are mild intensity.  Length of symptoms 10 years.  Medicines include OTC.  Complications include none.   Patient has prediabetes and is stable she is on metformin  Past Medical History:  Diagnosis Date   Prediabetes     Past Surgical History:  Procedure Laterality Date   CHOLECYSTECTOMY  07/24/2012   Procedure: LAPAROSCOPIC CHOLECYSTECTOMY WITH INTRAOPERATIVE CHOLANGIOGRAM;  Surgeon: Pedro Earls, MD;  Location: [facility];  Service: General;  Laterality: N/A;   HERNIA REPAIR  [id number]   umbilical   TUBAL LIGATION  1980    Family History  Problem Relation Age of Onset   Diabetes Mother    Heart disease Mother     Social History   Socioeconomic History   Marital status: Married    Spouse name: Not on file   Number of children: Not on file   Years of education: Not on file   Highest education level: Not on file  Occupational History   Occupation: Farm  Tobacco Use   Smoking status: Never   Smokeless tobacco: Never  Substance and Sexual Activity   Alcohol use: No   Drug use: No   Sexual activity: Not Currently  Other Topics Concern   Not on file  Social History Narrative   Not on file   Social Determinants of Health   Financial Resource Strain: Not on file  Food Insecurity: Not on file  Transportation Needs: Not on file  Physical  Activity: Not on file  Stress: Not on file  Social Connections: Not on file  Intimate Partner Violence: Not on file    Outpatient Medications Prior to Visit  Medication Sig Dispense Refill   metFORMIN (GLUCOPHAGE) 500 MG tablet TAKE 1 TABLET BY MOUTH TWICE DAILY 180 tablet 2   pravastatin (PRAVACHOL) 40 MG tablet TAKE 1 TABLET BY MOUTH ONCE DAILY. 90 tablet 2   zolpidem (AMBIEN) 10 MG tablet Take 1 tablet (10 mg total) by mouth at bedtime as needed. 30 tablet 3   No facility-administered medications prior to visit.    No Known Allergies  ROS Review of Systems  Constitutional:  Negative for activity change and appetite change.  HENT:  Negative for congestion.   Eyes:  Negative for visual disturbance.  Respiratory:  Negative for chest tightness and shortness of breath.   Cardiovascular:  Negative for chest pain, palpitations and leg swelling.  Gastrointestinal:  Negative for abdominal distention and abdominal pain.  Endocrine: Negative for polyuria.  Genitourinary:  Negative for difficulty urinating and dysuria.  Musculoskeletal:  Negative for arthralgias and back pain.  Skin: Negative.   Neurological: Negative.   Psychiatric/Behavioral: Negative.       Objective:    Physical Exam Vitals reviewed.  Constitutional:      Appearance: Normal appearance.  HENT:     Head: Normocephalic.     Right Ear: Tympanic membrane, ear canal and external ear  normal.     Left Ear: Tympanic membrane, ear canal and external ear normal.     Mouth/Throat:     Mouth: Mucous membranes are dry.     Pharynx: Oropharynx is clear.  Eyes:     Extraocular Movements: Extraocular movements intact.     Conjunctiva/sclera: Conjunctivae normal.     Pupils: Pupils are equal, round, and reactive to light.  Cardiovascular:     Rate and Rhythm: Normal rate and regular rhythm.     Pulses: Normal pulses.     Heart sounds: Normal heart sounds. No murmur heard.   No gallop.  Pulmonary:     Effort:  Pulmonary effort is normal. No respiratory distress.     Breath sounds: Normal breath sounds. No wheezing.  Abdominal:     General: Abdomen is flat. Bowel sounds are normal. There is no distension.     Tenderness: There is no abdominal tenderness.  Musculoskeletal:        General: Normal range of motion.     Cervical back: Normal range of motion.  Skin:    General: Skin is warm.     Capillary Refill: Capillary refill takes less than 2 seconds.  Neurological:     General: No focal deficit present.     Mental Status: She is alert and oriented to person, place, and time. Mental status is at baseline.  Psychiatric:        Mood and Affect: Mood normal.        Behavior: Behavior normal.        Thought Content: Thought content normal.    BP 120/60   Pulse 67   Temp 97.6 F (36.4 C)   Resp 15   Ht '5\' 6"'  (1.676 m)   Wt 133 lb (60.3 kg)   SpO2 98%   BMI 21.47 kg/m  Wt Readings from Last 3 Encounters:  05/13/21 133 lb (60.3 kg)  01/28/21 138 lb 9.6 oz (62.9 kg)  01/13/21 136 lb (61.7 kg)     Health Maintenance Due  Topic Date Due   Hepatitis C Screening  Never done   TETANUS/TDAP  Never done   Zoster Vaccines- Shingrix (1 of 2) Never done   MAMMOGRAM  09/02/2020   COVID-19 Vaccine (4 - Booster for Moderna series) 01/24/2021    There are no preventive care reminders to display for this patient.  Lab Results  Component Value Date   TSH 1.450 05/13/2021   Lab Results  Component Value Date   WBC 6.6 05/13/2021   HGB 12.2 05/13/2021   HCT 36.7 05/13/2021   MCV 91 05/13/2021   PLT 304 05/13/2021   Lab Results  Component Value Date   NA 139 05/13/2021   K 5.0 05/13/2021   CO2 23 05/13/2021   GLUCOSE 95 05/13/2021   BUN 17 05/13/2021   CREATININE 0.76 05/13/2021   BILITOT 0.3 05/13/2021   ALKPHOS 80 05/13/2021   AST 14 05/13/2021   ALT 13 05/13/2021   PROT 6.6 05/13/2021   ALBUMIN 4.4 05/13/2021   CALCIUM 10.0 05/13/2021   EGFR 84 05/13/2021   Lab Results   Component Value Date   CHOL 159 05/13/2021   Lab Results  Component Value Date   HDL 62 05/13/2021   Lab Results  Component Value Date   LDLCALC 77 05/13/2021   Lab Results  Component Value Date   TRIG 111 05/13/2021   Lab Results  Component Value Date   CHOLHDL 2.6 05/13/2021   Lab  Results  Component Value Date   HGBA1C 5.8 (H) 05/13/2021      Assessment & Plan:   Problem List Items Addressed This Visit       Digestive   GERD with esophagitis (Chronic) Plan of care was formulated today.  She is doing well.  A plan of care was formulated using patient exam, tests and other sources to optimize care using evidence based information.  Recommend no smoking, no eating after supper, avoid fatty foods, elevate Head of bed, avoid tight fitting clothing.  Continue on medicines.      Musculoskeletal and Integument   Osteopenia (Chronic) AN INDIVIDUAL CARE PLAN for osteopenia was established and reinforced today.  The patient's status was assessed using clinical findings on exam, labs, and other diagnostic testing. Patient's success at meeting treatment goals based on disease specific evidence-bassed guidelines and found to be in fair control. RECOMMENDATIONS include maintain present medicines and treatment.     Arthritis   Relevant Medications   meloxicam (MOBIC) 15 MG tablet AN INDIVIDUAL CARE PLAN osteoarthritis was established and reinforced today.  The patient's status was assessed using clinical findings on exam, labs, and other diagnostic testing. Patient's success at meeting treatment goals based on disease specific evidence-bassed guidelines and found to be in fair control. RECOMMENDATIONS include start mobic.      Other   Mixed hyperlipidemia (Chronic)   Relevant Orders   Lipid panel (Completed)   TSH (Completed) AN INDIVIDUAL CARE PLAN for hyperlipidemia/ cholesterol was established and reinforced today.  The patient's status was assessed using clinical findings on  exam, lab and other diagnostic tests. The patient's disease status was assessed based on evidence-based guidelines and found to be fair controlled. MEDICATIONS were reviewed. SELF MANAGEMENT GOALS have been discussed and patient's success at attaining the goal of low cholesterol was assessed. RECOMMENDATION given include regular exercise 3 days a week and low cholesterol/low fat diet. CLINICAL SUMMARY including written plan to identify barriers unique to the patient due to social or economic  reasons was discussed.     Adjustment insomnia (Chronic) AN INDIVIDUAL CARE PLAN adjustment insomnia was established and reinforced today.  The patient's status was assessed using clinical findings on exam, labs, and other diagnostic testing. Patient's success at meeting treatment goals based on disease specific evidence-bassed guidelines and found to be in good control. RECOMMENDATIONS include maintaining present medicines and treatment. We discussed doing a drug free holiday.    Prediabetes - Primary   Relevant Orders   Comprehensive metabolic panel (Completed)   Hemoglobin A1c (Completed)   CBC with Differential/Platelet (Completed) Patient has prediabetes and is on diet    Meds ordered this encounter  Medications   meloxicam (MOBIC) 15 MG tablet    Sig: Take 1 tablet (15 mg total) by mouth daily.    Dispense:  30 tablet    Refill:  3    Follow-up: Return in about 4 months (around 09/12/2021) for fasting.    Reinaldo Meeker, MD

## 2021-05-14 LAB — COMPREHENSIVE METABOLIC PANEL
ALT: 13 IU/L (ref 0–32)
AST: 14 IU/L (ref 0–40)
Albumin/Globulin Ratio: 2 (ref 1.2–2.2)
Albumin: 4.4 g/dL (ref 3.8–4.8)
Alkaline Phosphatase: 80 IU/L (ref 44–121)
BUN/Creatinine Ratio: 22 (ref 12–28)
BUN: 17 mg/dL (ref 8–27)
Bilirubin Total: 0.3 mg/dL (ref 0.0–1.2)
CO2: 23 mmol/L (ref 20–29)
Calcium: 10 mg/dL (ref 8.7–10.3)
Chloride: 102 mmol/L (ref 96–106)
Creatinine, Ser: 0.76 mg/dL (ref 0.57–1.00)
Globulin, Total: 2.2 g/dL (ref 1.5–4.5)
Glucose: 95 mg/dL (ref 65–99)
Potassium: 5 mmol/L (ref 3.5–5.2)
Sodium: 139 mmol/L (ref 134–144)
Total Protein: 6.6 g/dL (ref 6.0–8.5)
eGFR: 84 mL/min/{1.73_m2} (ref >59)

## 2021-05-14 LAB — CBC WITH DIFFERENTIAL/PLATELET
Basophils Absolute: 0.1 10*3/uL (ref 0.0–0.2)
Basos: 2 %
EOS (ABSOLUTE): 0.1 10*3/uL (ref 0.0–0.4)
Eos: 2 %
Hematocrit: 36.7 % (ref 34.0–46.6)
Hemoglobin: 12.2 g/dL (ref 11.1–15.9)
Immature Grans (Abs): 0 10*3/uL (ref 0.0–0.1)
Immature Granulocytes: 0 %
Lymphocytes Absolute: 1.9 10*3/uL (ref 0.7–3.1)
Lymphs: 29 %
MCH: 30.3 pg (ref 26.6–33.0)
MCHC: 33.2 g/dL (ref 31.5–35.7)
MCV: 91 fL (ref 79–97)
Monocytes Absolute: 0.5 10*3/uL (ref 0.1–0.9)
Monocytes: 7 %
Neutrophils Absolute: 4.1 10*3/uL (ref 1.4–7.0)
Neutrophils: 60 %
Platelets: 304 10*3/uL (ref 150–450)
RBC: 4.02 x10E6/uL (ref 3.77–5.28)
RDW: 12.4 % (ref 11.7–15.4)
WBC: 6.6 10*3/uL (ref 3.4–10.8)

## 2021-05-14 LAB — LIPID PANEL
Chol/HDL Ratio: 2.6 ratio (ref 0.0–4.4)
Cholesterol, Total: 159 mg/dL (ref 100–199)
HDL: 62 mg/dL (ref >39)
LDL Chol Calc (NIH): 77 mg/dL (ref 0–99)
Triglycerides: 111 mg/dL (ref 0–149)
VLDL Cholesterol Cal: 20 mg/dL (ref 5–40)

## 2021-05-14 LAB — HEMOGLOBIN A1C
Est. average glucose Bld gHb Est-mCnc: 120 mg/dL
Hgb A1c MFr Bld: 5.8 % — ABNORMAL HIGH (ref 4.8–5.6)

## 2021-05-14 LAB — TSH: TSH: 1.45 u[IU]/mL (ref 0.450–4.500)

## 2021-05-14 LAB — CARDIOVASCULAR RISK ASSESSMENT

## 2021-05-15 NOTE — Progress Notes (Signed)
Kidney and liver tests normal, A1c 5.8 good, lipids normal, TSH 1.45 good, CBC normal lp

## 2021-06-24 ENCOUNTER — Other Ambulatory Visit: Payer: Self-pay | Admitting: Legal Medicine

## 2021-06-24 DIAGNOSIS — E782 Mixed hyperlipidemia: Secondary | ICD-10-CM

## 2021-07-25 ENCOUNTER — Ambulatory Visit (INDEPENDENT_AMBULATORY_CARE_PROVIDER_SITE_OTHER): Payer: PPO | Admitting: Legal Medicine

## 2021-07-25 ENCOUNTER — Encounter: Payer: Self-pay | Admitting: Legal Medicine

## 2021-07-25 ENCOUNTER — Other Ambulatory Visit: Payer: Self-pay

## 2021-07-25 VITALS — BP 108/70 | HR 72 | Temp 97.8°F | Resp 16 | Ht 66.0 in | Wt 136.0 lb

## 2021-07-25 DIAGNOSIS — K148 Other diseases of tongue: Secondary | ICD-10-CM

## 2021-07-25 NOTE — Progress Notes (Signed)
Established Patient Office Visit  Subjective:  Patient ID: Catherine Stokes, female    DOB: [birth year]  Age: 70 y.o. MRN: [id number]  CC:  Chief Complaint  Patient presents with   bump on tounge    Patient noticed a bump on the right side of the tongue since one year ago. She denied pain, itchy or burning in the area.    HPI Catherine Stokes presents for mass on tongue for one year. 65m pink lesion on right side of tongue for one year.  No pain and no bleeding.  Never smoker.  Past Medical History:  Diagnosis Date   Prediabetes     Past Surgical History:  Procedure Laterality Date   CHOLECYSTECTOMY  07/24/2012   Procedure: LAPAROSCOPIC CHOLECYSTECTOMY WITH INTRAOPERATIVE CHOLANGIOGRAM;  Surgeon: MPedro Earls MD;  Location: [facility];  Service: General;  Laterality: N/A;   HERNIA REPAIR  [id number]  umbilical   TUBAL LIGATION  1980    Family History  Problem Relation Age of Onset   Diabetes Mother    Heart disease Mother     Social History   Socioeconomic History   Marital status: Married    Spouse name: Not on file   Number of children: Not on file   Years of education: Not on file   Highest education level: Not on file  Occupational History   Occupation: Farm  Tobacco Use   Smoking status: Never   Smokeless tobacco: Never  Substance and Sexual Activity   Alcohol use: No   Drug use: No   Sexual activity: Not Currently  Other Topics Concern   Not on file  Social History Narrative   Not on file   Social Determinants of Health   Financial Resource Strain: Not on file  Food Insecurity: Not on file  Transportation Needs: Not on file  Physical Activity: Not on file  Stress: Not on file  Social Connections: Not on file  Intimate Partner Violence: Not on file    Outpatient Medications Prior to Visit  Medication Sig Dispense Refill   meloxicam (MOBIC) 15 MG tablet Take 1 tablet (15 mg total) by mouth daily. 30 tablet 3   metFORMIN (GLUCOPHAGE) 500 MG  tablet TAKE 1 TABLET BY MOUTH TWICE DAILY 180 tablet 2   pravastatin (PRAVACHOL) 40 MG tablet TAKE 1 TABLET BY MOUTH ONCE DAILY. 90 tablet 2   zolpidem (AMBIEN) 10 MG tablet Take 1 tablet (10 mg total) by mouth at bedtime as needed. 30 tablet 3   No facility-administered medications prior to visit.    No Known Allergies  ROS Review of Systems  Constitutional:  Negative for activity change and appetite change.  HENT:  Negative for congestion.   Eyes:  Negative for visual disturbance.  Respiratory:  Negative for chest tightness and shortness of breath.   Gastrointestinal:  Negative for abdominal distention and abdominal pain.  Genitourinary:  Negative for difficulty urinating and dysuria.  Musculoskeletal:  Negative for arthralgias and back pain.  Neurological: Negative.   Psychiatric/Behavioral:  Negative for agitation and behavioral problems.      Objective:    Physical Exam HENT:     Head: Normocephalic and atraumatic.     Right Ear: Tympanic membrane normal.     Left Ear: Tympanic membrane normal.     Mouth/Throat:     Mouth: Mucous membranes are dry.     Pharynx: Oropharynx is clear.     Comments: 580mpink well circumscribed lesion on right  side of tongue anteriorly.  No pain and no bleeding Cardiovascular:     Rate and Rhythm: Normal rate and regular rhythm.     Pulses: Normal pulses.     Heart sounds: Normal heart sounds. No murmur heard.   No gallop.  Pulmonary:     Effort: Pulmonary effort is normal.     Breath sounds: Normal breath sounds.  Skin:    General: Skin is warm.     Capillary Refill: Capillary refill takes less than 2 seconds.    BP 108/70   Pulse 72   Temp 97.8 F (36.6 C)   Resp 16   Ht '5\' 6"'  (1.676 m)   Wt 136 lb (61.7 kg)   SpO2 98%   BMI 21.95 kg/m  Wt Readings from Last 3 Encounters:  07/25/21 136 lb (61.7 kg)  05/13/21 133 lb (60.3 kg)  01/28/21 138 lb 9.6 oz (62.9 kg)     Health Maintenance Due  Topic Date Due   Hepatitis C  Screening  Never done   TETANUS/TDAP  Never done   Zoster Vaccines- Shingrix (1 of 2) Never done   MAMMOGRAM  09/02/2020   COVID-19 Vaccine (4 - Booster for Moderna series) 01/24/2021   INFLUENZA VACCINE  06/27/2021   URINE MICROALBUMIN  08/31/2021    There are no preventive care reminders to display for this patient.  Lab Results  Component Value Date   TSH 1.450 05/13/2021   Lab Results  Component Value Date   WBC 6.6 05/13/2021   HGB 12.2 05/13/2021   HCT 36.7 05/13/2021   MCV 91 05/13/2021   PLT 304 05/13/2021   Lab Results  Component Value Date   NA 139 05/13/2021   K 5.0 05/13/2021   CO2 23 05/13/2021   GLUCOSE 95 05/13/2021   BUN 17 05/13/2021   CREATININE 0.76 05/13/2021   BILITOT 0.3 05/13/2021   ALKPHOS 80 05/13/2021   AST 14 05/13/2021   ALT 13 05/13/2021   PROT 6.6 05/13/2021   ALBUMIN 4.4 05/13/2021   CALCIUM 10.0 05/13/2021   EGFR 84 05/13/2021   Lab Results  Component Value Date   CHOL 159 05/13/2021   Lab Results  Component Value Date   HDL 62 05/13/2021   Lab Results  Component Value Date   LDLCALC 77 05/13/2021   Lab Results  Component Value Date   TRIG 111 05/13/2021   Lab Results  Component Value Date   CHOLHDL 2.6 05/13/2021   Lab Results  Component Value Date   HGBA1C 5.8 (H) 05/13/2021      Assessment & Plan:   Diagnoses and all orders for this visit: Tongue lesion -     Ambulatory referral to ENT  Growth on right side of tongue, may need biopsy   Follow-up: Return if symptoms worsen or fail to improve.    Reinaldo Meeker, MD

## 2021-08-16 ENCOUNTER — Other Ambulatory Visit: Payer: Self-pay | Admitting: Legal Medicine

## 2021-08-19 DIAGNOSIS — D490 Neoplasm of unspecified behavior of digestive system: Secondary | ICD-10-CM | POA: Diagnosis not present

## 2021-09-14 ENCOUNTER — Ambulatory Visit: Payer: PPO | Admitting: Legal Medicine

## 2021-09-21 ENCOUNTER — Ambulatory Visit: Payer: PPO | Admitting: Legal Medicine

## 2021-09-21 ENCOUNTER — Other Ambulatory Visit: Payer: Self-pay | Admitting: Legal Medicine

## 2021-09-21 DIAGNOSIS — M1990 Unspecified osteoarthritis, unspecified site: Secondary | ICD-10-CM

## 2021-10-05 ENCOUNTER — Ambulatory Visit (INDEPENDENT_AMBULATORY_CARE_PROVIDER_SITE_OTHER): Payer: PPO | Admitting: Legal Medicine

## 2021-10-05 ENCOUNTER — Other Ambulatory Visit: Payer: Self-pay

## 2021-10-05 ENCOUNTER — Encounter: Payer: Self-pay | Admitting: Legal Medicine

## 2021-10-05 VITALS — BP 110/70 | HR 71 | Temp 97.4°F | Resp 16 | Ht 66.0 in | Wt 132.0 lb

## 2021-10-05 DIAGNOSIS — R7303 Prediabetes: Secondary | ICD-10-CM

## 2021-10-05 DIAGNOSIS — M8588 Other specified disorders of bone density and structure, other site: Secondary | ICD-10-CM | POA: Diagnosis not present

## 2021-10-05 DIAGNOSIS — K5909 Other constipation: Secondary | ICD-10-CM | POA: Diagnosis not present

## 2021-10-05 DIAGNOSIS — E782 Mixed hyperlipidemia: Secondary | ICD-10-CM

## 2021-10-05 DIAGNOSIS — K2100 Gastro-esophageal reflux disease with esophagitis, without bleeding: Secondary | ICD-10-CM

## 2021-10-05 DIAGNOSIS — F5102 Adjustment insomnia: Secondary | ICD-10-CM

## 2021-10-05 NOTE — Progress Notes (Signed)
Subjective:  Patient ID: Catherine Stokes, female    DOB: 04/27/1951  Age: 70 y.o. MRN: [id number]  Chief Complaint  Patient presents with   Prediabetes   Hyperlipidemia   Gastroesophageal Reflux     HPI: chronic visit  Patient has prediabetes and on diet she is on metformin  Patient presents with hyperlipidemia.  Compliance with treatment has been good; patient takes medicines as directed, maintains low cholesterol diet, follows up as directed, and maintains exercise regimen.  Patient is using pravastatin without problems.    Current Outpatient Medications on File Prior to Visit  Medication Sig Dispense Refill   meloxicam (MOBIC) 15 MG tablet TAKE 1 TABLET(15 MG) BY MOUTH DAILY 30 tablet 3   metFORMIN (GLUCOPHAGE) 500 MG tablet TAKE 1 TABLET BY MOUTH TWICE DAILY 180 tablet 2   pravastatin (PRAVACHOL) 40 MG tablet TAKE 1 TABLET BY MOUTH ONCE DAILY. 90 tablet 2   zolpidem (AMBIEN) 10 MG tablet TAKE 1 TABLET(10 MG) BY MOUTH AT BEDTIME AS NEEDED 30 tablet 3   No current facility-administered medications on file prior to visit.   Past Medical History:  Diagnosis Date   Prediabetes    Past Surgical History:  Procedure Laterality Date   CHOLECYSTECTOMY  07/24/2012   Procedure: LAPAROSCOPIC CHOLECYSTECTOMY WITH INTRAOPERATIVE CHOLANGIOGRAM;  Surgeon: Pedro Earls, MD;  Location: [facility];  Service: General;  Laterality: N/A;   HERNIA REPAIR  [id number]   umbilical   TUBAL LIGATION  1980    Family History  Problem Relation Age of Onset   Diabetes Mother    Heart disease Mother    Social History   Socioeconomic History   Marital status: Married    Spouse name: Not on file   Number of children: Not on file   Years of education: Not on file   Highest education level: Not on file  Occupational History   Occupation: Farm  Tobacco Use   Smoking status: Never   Smokeless tobacco: Never  Substance and Sexual Activity   Alcohol use: No   Drug use: No   Sexual activity: Not  Currently  Other Topics Concern   Not on file  Social History Narrative   Not on file   Social Determinants of Health   Financial Resource Strain: Not on file  Food Insecurity: Not on file  Transportation Needs: Not on file  Physical Activity: Not on file  Stress: Not on file  Social Connections: Not on file    Review of Systems  Constitutional:  Negative for chills, fatigue and fever.  HENT:  Negative for congestion, ear pain and sore throat.   Respiratory:  Negative for cough and shortness of breath.   Cardiovascular:  Negative for chest pain and palpitations.  Gastrointestinal:  Negative for abdominal pain, constipation, diarrhea, nausea and vomiting.  Endocrine: Negative for polydipsia, polyphagia and polyuria.  Genitourinary:  Negative for difficulty urinating and dysuria.  Musculoskeletal:  Negative for arthralgias, back pain and myalgias.  Skin:  Negative for rash.  Neurological:  Negative for headaches.  Psychiatric/Behavioral:  Negative for dysphoric mood. The patient is not nervous/anxious.     Objective:  BP 110/70   Pulse 71   Temp (!) 97.4 F (36.3 C)   Resp 16   Ht 5\' 6"  (1.676 m)   Wt 132 lb (59.9 kg)   SpO2 97%   BMI 21.31 kg/m   BP/Weight 10/05/2021 07/25/2021 2/70/3500  Systolic BP 938 182 993  Diastolic BP 70 70 60  Wt. (Lbs) 132 136 133  BMI 21.31 21.95 21.47    Physical Exam Vitals reviewed.  Constitutional:      Appearance: Normal appearance.  HENT:     Head: Normocephalic.     Right Ear: Tympanic membrane, ear canal and external ear normal.     Left Ear: Tympanic membrane, ear canal and external ear normal.     Mouth/Throat:     Mouth: Mucous membranes are moist.  Eyes:     Extraocular Movements: Extraocular movements intact.     Conjunctiva/sclera: Conjunctivae normal.     Pupils: Pupils are equal, round, and reactive to light.  Cardiovascular:     Rate and Rhythm: Normal rate and regular rhythm.     Pulses: Normal pulses.      Heart sounds: Normal heart sounds. No murmur heard.   No gallop.  Pulmonary:     Effort: Pulmonary effort is normal. No respiratory distress.     Breath sounds: Normal breath sounds. No wheezing.  Abdominal:     General: Abdomen is flat. Bowel sounds are normal. There is no distension.     Palpations: Abdomen is soft.     Tenderness: There is no abdominal tenderness.  Musculoskeletal:        General: Normal range of motion.     Cervical back: Normal range of motion and neck supple.  Skin:    General: Skin is warm.     Capillary Refill: Capillary refill takes less than 2 seconds.  Neurological:     General: No focal deficit present.     Mental Status: She is alert and oriented to person, place, and time. Mental status is at baseline.    Diabetic Foot Exam - Simple   Simple Foot Form Diabetic Foot exam was performed with the following findings: Yes 10/05/2021  8:44 AM  Visual Inspection No deformities, no ulcerations, no other skin breakdown bilaterally: Yes Sensation Testing See comments: Yes Pulse Check Posterior Tibialis and Dorsalis pulse intact bilaterally: Yes Comments Slight decrease with like touch      Lab Results  Component Value Date   WBC 6.4 10/05/2021   HGB 12.0 10/05/2021   HCT 36.6 10/05/2021   PLT 348 10/05/2021   GLUCOSE 87 10/05/2021   CHOL 177 10/05/2021   TRIG 96 10/05/2021   HDL 62 10/05/2021   LDLCALC 98 10/05/2021   ALT 13 10/05/2021   AST 15 10/05/2021   NA 137 10/05/2021   K 4.6 10/05/2021   CL 101 10/05/2021   CREATININE 0.74 10/05/2021   BUN 18 10/05/2021   CO2 26 10/05/2021   TSH 1.450 05/13/2021   HGBA1C 5.7 (H) 10/05/2021   MICROALBUR 30 08/31/2020      Assessment & Plan:   Problem List Items Addressed This Visit       Digestive   GERD with esophagitis (Chronic)   Relevant Orders   CBC with Differential/Platelet (Completed) Plan of care was formulated today .  She is doing well.  A plan of care was formulated using  patient exam, tests and other sources to optimize care using evidence based information.  Recommend no smoking, no eating after supper, avoid fatty foods, elevate Head of bed, avoid tight fitting clothing.  Continue on OTC .    Chronic constipation Patient has chronic constipation and using linzess     Musculoskeletal and Integument   Osteopenia (Chronic) Patient is on vitamin D and calcium     Other   Mixed hyperlipidemia (Chronic)  Relevant Orders   Lipid panel (Completed) AN INDIVIDUAL CARE PLAN for hyperlipidemia/ cholesterol was established and reinforced today.  The patient's status was assessed using clinical findings on exam, lab and other diagnostic tests. The patient's disease status was assessed based on evidence-based guidelines and found to be well controlled. MEDICATIONS were reviewed. SELF MANAGEMENT GOALS have been discussed and patient's success at attaining the goal of low cholesterol was assessed. RECOMMENDATION given include regular exercise 3 days a week and low cholesterol/low fat diet. CLINICAL SUMMARY including written plan to identify barriers unique to the patient due to social or economic  reasons was discussed.     Adjustment insomnia (Chronic) Patient has chronic insomnia using ambien without abuse    Prediabetes - Primary   Relevant Orders   Comprehensive metabolic panel (Completed)   Hemoglobin A1c (Completed) Patient has prediabetes and is working on weight and diet  .     Orders Placed This Encounter  Procedures   Comprehensive metabolic panel   Hemoglobin A1c   Lipid panel   CBC with Differential/Platelet   Cardiovascular Risk Assessment      Follow-up: Return in about 4 months (around 02/02/2022) for fasting.  An After Visit Summary was printed and given to the patient.  Reinaldo Meeker, MD [facility] [phone]

## 2021-10-06 LAB — HEMOGLOBIN A1C
Est. average glucose Bld gHb Est-mCnc: 117 mg/dL
Hgb A1c MFr Bld: 5.7 % — ABNORMAL HIGH (ref 4.8–5.6)

## 2021-10-06 LAB — COMPREHENSIVE METABOLIC PANEL
ALT: 13 IU/L (ref 0–32)
AST: 15 IU/L (ref 0–40)
Albumin/Globulin Ratio: 1.7 (ref 1.2–2.2)
Albumin: 4.3 g/dL (ref 3.8–4.8)
Alkaline Phosphatase: 76 IU/L (ref 44–121)
BUN/Creatinine Ratio: 24 (ref 12–28)
BUN: 18 mg/dL (ref 8–27)
Bilirubin Total: 0.3 mg/dL (ref 0.0–1.2)
CO2: 26 mmol/L (ref 20–29)
Calcium: 9.8 mg/dL (ref 8.7–10.3)
Chloride: 101 mmol/L (ref 96–106)
Creatinine, Ser: 0.74 mg/dL (ref 0.57–1.00)
Globulin, Total: 2.5 g/dL (ref 1.5–4.5)
Glucose: 87 mg/dL (ref 70–99)
Potassium: 4.6 mmol/L (ref 3.5–5.2)
Sodium: 137 mmol/L (ref 134–144)
Total Protein: 6.8 g/dL (ref 6.0–8.5)
eGFR: 87 mL/min/{1.73_m2} (ref >59)

## 2021-10-06 LAB — CBC WITH DIFFERENTIAL/PLATELET
Basophils Absolute: 0.1 10*3/uL (ref 0.0–0.2)
Basos: 1 %
EOS (ABSOLUTE): 0.1 10*3/uL (ref 0.0–0.4)
Eos: 2 %
Hematocrit: 36.6 % (ref 34.0–46.6)
Hemoglobin: 12 g/dL (ref 11.1–15.9)
Immature Grans (Abs): 0 10*3/uL (ref 0.0–0.1)
Immature Granulocytes: 0 %
Lymphocytes Absolute: 2 10*3/uL (ref 0.7–3.1)
Lymphs: 32 %
MCH: 29.9 pg (ref 26.6–33.0)
MCHC: 32.8 g/dL (ref 31.5–35.7)
MCV: 91 fL (ref 79–97)
Monocytes Absolute: 0.4 10*3/uL (ref 0.1–0.9)
Monocytes: 7 %
Neutrophils Absolute: 3.7 10*3/uL (ref 1.4–7.0)
Neutrophils: 58 %
Platelets: 348 10*3/uL (ref 150–450)
RBC: 4.01 x10E6/uL (ref 3.77–5.28)
RDW: 12.3 % (ref 11.7–15.4)
WBC: 6.4 10*3/uL (ref 3.4–10.8)

## 2021-10-06 LAB — LIPID PANEL
Chol/HDL Ratio: 2.9 ratio (ref 0.0–4.4)
Cholesterol, Total: 177 mg/dL (ref 100–199)
HDL: 62 mg/dL (ref >39)
LDL Chol Calc (NIH): 98 mg/dL (ref 0–99)
Triglycerides: 96 mg/dL (ref 0–149)
VLDL Cholesterol Cal: 17 mg/dL (ref 5–40)

## 2021-10-06 LAB — CARDIOVASCULAR RISK ASSESSMENT

## 2021-10-06 NOTE — Progress Notes (Signed)
Kidney and liver tests normal, A1c 5.7 good, cholesterol negative, CBC normal lp

## 2021-11-08 ENCOUNTER — Other Ambulatory Visit: Payer: Self-pay | Admitting: Obstetrics and Gynecology

## 2021-11-08 DIAGNOSIS — Z1231 Encounter for screening mammogram for malignant neoplasm of breast: Secondary | ICD-10-CM

## 2021-11-23 DIAGNOSIS — D490 Neoplasm of unspecified behavior of digestive system: Secondary | ICD-10-CM | POA: Diagnosis not present

## 2021-11-24 DIAGNOSIS — D101 Benign neoplasm of tongue: Secondary | ICD-10-CM | POA: Diagnosis not present

## 2021-12-09 ENCOUNTER — Ambulatory Visit
Admission: RE | Admit: 2021-12-09 | Discharge: 2021-12-09 | Disposition: A | Discharge status: Home / Self Care | Payer: PPO | Source: Ambulatory Visit | Attending: Obstetrics and Gynecology | Admitting: Obstetrics and Gynecology

## 2021-12-09 DIAGNOSIS — Z1231 Encounter for screening mammogram for malignant neoplasm of breast: Secondary | ICD-10-CM | POA: Diagnosis not present

## 2022-02-02 NOTE — Progress Notes (Signed)
? ?Subjective:  ?Patient ID: Catherine Stokes, female    DOB: 14-Apr-1951  Age: 71 y.o. MRN: [id number] ? ?Chief Complaint  ?Patient presents with  ? Prediabetes  ? Hyperlipidemia  ? ? ?HPI: chronic visit ?  ?Prediabetes: She takes Metformin 500 mg twice a day. No complications ? ?Hyperlipidemia: Patient takes pravastatin 40 mg daily. ?Patient presents with hyperlipidemia.  Compliance with treatment has been good; patient takes medicines as directed, maintains low cholesterol diet, follows up as directed, and maintains exercise regimen.  Patient is using pravastatin without problems.  ? ? ?Current Outpatient Medications on File Prior to Visit  ?Medication Sig Dispense Refill  ? meloxicam (MOBIC) 15 MG tablet TAKE 1 TABLET(15 MG) BY MOUTH DAILY 30 tablet 3  ? metFORMIN (GLUCOPHAGE) 500 MG tablet TAKE 1 TABLET BY MOUTH TWICE DAILY 180 tablet 2  ? pravastatin (PRAVACHOL) 40 MG tablet TAKE 1 TABLET BY MOUTH ONCE DAILY. 90 tablet 2  ? zolpidem (AMBIEN) 10 MG tablet TAKE 1 TABLET(10 MG) BY MOUTH AT BEDTIME AS NEEDED 30 tablet 3  ? ?No current facility-administered medications on file prior to visit.  ? ?Past Medical History:  ?Diagnosis Date  ? Prediabetes   ? ?Past Surgical History:  ?Procedure Laterality Date  ? CHOLECYSTECTOMY  07/24/2012  ? Procedure: LAPAROSCOPIC CHOLECYSTECTOMY WITH INTRAOPERATIVE CHOLANGIOGRAM;  Surgeon: Pedro Earls, MD;  Location: [facility];  Service: General;  Laterality: N/A;  ? HERNIA REPAIR  8101  ? umbilical  ? [place]  ?  ?Family History  ?Problem Relation Age of Onset  ? Diabetes Mother   ? Heart disease Mother   ? ?Social History  ? ?Socioeconomic History  ? Marital status: Married  ?  Spouse name: Not on file  ? Number of children: Not on file  ? Years of education: Not on file  ? Highest education level: Not on file  ?Occupational History  ? Occupation: Farm  ?Tobacco Use  ? Smoking status: Never  ? Smokeless tobacco: Never  ?Substance and Sexual Activity  ? Alcohol use: No   ? Drug use: No  ? Sexual activity: Not Currently  ?Other Topics Concern  ? Not on file  ?Social History Narrative  ? Not on file  ? ?Social Determinants of Health  ? ?Financial Resource Strain: Not on file  ?Food Insecurity: Not on file  ?Transportation Needs: Not on file  ?Physical Activity: Not on file  ?Stress: Not on file  ?Social Connections: Not on file  ? ? ?Review of Systems  ?Constitutional:  Negative for chills, fatigue and fever.  ?HENT:  Negative for congestion, ear pain and sore throat.   ?Eyes:  Negative for visual disturbance.  ?Respiratory:  Negative for cough and shortness of breath.   ?Cardiovascular:  Negative for chest pain and palpitations.  ?Gastrointestinal:  Negative for abdominal pain, constipation, diarrhea, nausea and vomiting.  ?Endocrine: Negative for polydipsia, polyphagia and polyuria.  ?Genitourinary:  Negative for difficulty urinating and dysuria.  ?Musculoskeletal:  Negative for arthralgias, back pain and myalgias.  ?Skin:  Negative for rash.  ?Neurological:  Negative for headaches.  ?Hematological: Negative.   ?Psychiatric/Behavioral:  Negative for dysphoric mood. The patient is not nervous/anxious.   ? ? ?Objective:  ?BP 100/60   Pulse 71   Temp 98.6 ?F (37 ?C)   Resp 15   Ht '5\' 6"'$  (1.676 m)   Wt 133 lb (60.3 kg)   SpO2 98%   BMI 21.47 kg/m?  ? ?BP/Weight 02/03/2022 10/05/2021  07/25/2021  ?Systolic BP 568 127 517  ?Diastolic BP 60 70 70  ?Wt. (Lbs) 133 132 136  ?BMI 21.47 21.31 21.95  ? ? ?Physical Exam ?Vitals reviewed.  ?Constitutional:   ?   General: She is not in acute distress. ?   Appearance: Normal appearance.  ?HENT:  ?   Head: Normocephalic.  ?   Right Ear: Tympanic membrane normal.  ?   Left Ear: Tympanic membrane normal.  ?   Nose: Nose normal.  ?Eyes:  ?   Conjunctiva/sclera: Conjunctivae normal.  ?Cardiovascular:  ?   Rate and Rhythm: Normal rate and regular rhythm.  ?   Pulses: Normal pulses.  ?   Heart sounds: Normal heart sounds. No murmur heard. ?  No gallop.   ?Pulmonary:  ?   Effort: Pulmonary effort is normal. No respiratory distress.  ?   Breath sounds: No wheezing.  ?Abdominal:  ?   General: Bowel sounds are normal. There is no distension.  ?   Tenderness: There is no abdominal tenderness.  ?Musculoskeletal:  ?   Cervical back: Normal range of motion and neck supple.  ?   Right lower leg: No edema.  ?   Left lower leg: No edema.  ?   Comments: Low back pain, guteal pain  ?Skin: ?   General: Skin is warm.  ?   Capillary Refill: Capillary refill takes less than 2 seconds.  ?Neurological:  ?   General: No focal deficit present.  ?   Mental Status: She is alert and oriented to person, place, and time.  ?   Gait: Gait normal.  ?   Deep Tendon Reflexes: Reflexes normal.  ? ? ? ?  ? ?Lab Results  ?Component Value Date  ? WBC 6.2 02/03/2022  ? HGB 12.7 02/03/2022  ? HCT 38.5 02/03/2022  ? PLT 310 02/03/2022  ? GLUCOSE 98 02/03/2022  ? CHOL 161 02/03/2022  ? TRIG 109 02/03/2022  ? HDL 70 02/03/2022  ? [place] 72 02/03/2022  ? ALT 12 02/03/2022  ? AST 14 02/03/2022  ? NA 138 02/03/2022  ? K 5.2 02/03/2022  ? CL 102 02/03/2022  ? CREATININE 0.73 02/03/2022  ? BUN 19 02/03/2022  ? CO2 23 02/03/2022  ? TSH 1.450 05/13/2021  ? HGBA1C 5.7 (H) 02/03/2022  ? MICROALBUR 30 08/31/2020  ? ? ? ? ?Assessment & Plan:  ? ?Problem List Items Addressed This Visit   ? ?  ? Digestive  ? GERD with esophagitis - Primary (Chronic)  ? Relevant Orders  ? CBC with Differential/Platelet (Completed) ?Plan of care was formulated today.  She is doing well.  A plan of care was formulated using patient exam, tests and other sources to optimize care using evidence based information.  Recommend no smoking, no eating after supper, avoid fatty foods, elevate Head of bed, avoid tight fitting clothing.  Continue on OTC.   ? Chronic constipation ?Patient has chronic constipation  ?  ? Musculoskeletal and Integument  ? Osteopenia (Chronic) ?Osteopenia on DEXA, on calcium and OTC vitamin D ?  ? Arthritis  ?  Back  and right hip ?Patient has OA and on meloxicam ?  ?  ?  ? Other  ? Mixed hyperlipidemia (Chronic)  ? Relevant Orders  ? Lipid panel (Completed) ?AN INDIVIDUAL CARE PLAN for hyperlipidemia/ cholesterol was established and reinforced today.  The patient's status was assessed using clinical findings on exam, lab and other diagnostic tests. The patient's disease status was assessed based  on evidence-based guidelines and found to be fair controlled. ?MEDICATIONS were reviewed. ?SELF MANAGEMENT GOALS have been discussed and patient's success at attaining the goal of low cholesterol was assessed. ?RECOMMENDATION given include regular exercise 3 days a week and low cholesterol/low fat diet. ?CLINICAL SUMMARY including written plan to identify barriers unique to the patient due to social or economic  reasons was discussed.  ?  ? Adjustment insomnia (Chronic) ?Patient has insomnia and on zolpidem PRN ?  ? Prediabetes  ? Relevant Orders  ? Comprehensive metabolic panel (Completed)  ? Hemoglobin A1c (Completed)  ? Microalbumin/Creatinine Ratio, Urine (Completed) ?Patient has prediabetes and on diet and exercise  ? BMI 21.0-21.9, adult ?Patient has ow body weight and on supplements  ?. ? ? ? ?Orders Placed This Encounter  ?Procedures  ? Comprehensive metabolic panel  ? Hemoglobin A1c  ? Lipid panel  ? CBC with Differential/Platelet  ? Microalbumin/Creatinine Ratio, Urine  ? Cardiovascular Risk Assessment  ?  ?30 minute visit with discussion on weight and protein supplements ?Follow-up: Return in about 4 months (around 06/05/2022) for fasting. ? ?An After Visit Summary was printed and given to the patient. ? ?Reinaldo Meeker, MD ?[facility] ?([phone] ?

## 2022-02-03 ENCOUNTER — Other Ambulatory Visit: Payer: Self-pay

## 2022-02-03 ENCOUNTER — Ambulatory Visit (INDEPENDENT_AMBULATORY_CARE_PROVIDER_SITE_OTHER): Payer: PPO | Admitting: Legal Medicine

## 2022-02-03 ENCOUNTER — Encounter: Payer: Self-pay | Admitting: Legal Medicine

## 2022-02-03 VITALS — BP 100/60 | HR 71 | Temp 98.6°F | Resp 15 | Ht 66.0 in | Wt 133.0 lb

## 2022-02-03 DIAGNOSIS — K5909 Other constipation: Secondary | ICD-10-CM | POA: Diagnosis not present

## 2022-02-03 DIAGNOSIS — E782 Mixed hyperlipidemia: Secondary | ICD-10-CM | POA: Diagnosis not present

## 2022-02-03 DIAGNOSIS — M8588 Other specified disorders of bone density and structure, other site: Secondary | ICD-10-CM | POA: Diagnosis not present

## 2022-02-03 DIAGNOSIS — F5102 Adjustment insomnia: Secondary | ICD-10-CM

## 2022-02-03 DIAGNOSIS — M1990 Unspecified osteoarthritis, unspecified site: Secondary | ICD-10-CM | POA: Diagnosis not present

## 2022-02-03 DIAGNOSIS — K2100 Gastro-esophageal reflux disease with esophagitis, without bleeding: Secondary | ICD-10-CM | POA: Diagnosis not present

## 2022-02-03 DIAGNOSIS — Z6821 Body mass index (BMI) 21.0-21.9, adult: Secondary | ICD-10-CM | POA: Diagnosis not present

## 2022-02-03 DIAGNOSIS — R7303 Prediabetes: Secondary | ICD-10-CM | POA: Diagnosis not present

## 2022-02-03 NOTE — Assessment & Plan Note (Signed)
Back and right hip ?

## 2022-02-03 NOTE — Patient Instructions (Signed)
Low Back Sprain or Strain Rehab Ask your health care provider which exercises are safe for you. Do exercises exactly as told by your health care provider and adjust them as directed. It is normal to feel mild stretching, pulling, tightness, or discomfort as you do these exercises. Stop right away if you feel sudden pain or your pain gets worse. Do not begin these exercises until told by your health care provider. Stretching and range-of-motion exercises These exercises warm up your muscles and joints and improve the movement and flexibility of your back. These exercises also help to relieve pain, numbness, and tingling. Lumbar rotation  Lie on your back on a firm bed or the floor with your knees bent. Straighten your arms out to your sides so each arm forms a 90-degree angle (right angle) with a side of your body. Slowly move (rotate) both of your knees to one side of your body until you feel a stretch in your lower back (lumbar). Try not to let your shoulders lift off the floor. Hold this position for __________ seconds. Tense your abdominal muscles and slowly move your knees back to the starting position. Repeat this exercise on the other side of your body. Repeat __________ times. Complete this exercise __________ times a day. Single knee to chest  Lie on your back on a firm bed or the floor with both legs straight. Bend one of your knees. Use your hands to move your knee up toward your chest until you feel a gentle stretch in your lower back and buttock. Hold your leg in this position by holding on to the front of your knee. Keep your other leg as straight as possible. Hold this position for __________ seconds. Slowly return to the starting position. Repeat with your other leg. Repeat __________ times. Complete this exercise __________ times a day. Prone extension on elbows  Lie on your abdomen on a firm bed or the floor (prone position). Prop yourself up on your elbows. Use your arms  to help lift your chest up until you feel a gentle stretch in your abdomen and your lower back. This will place some of your body weight on your elbows. If this is uncomfortable, try stacking pillows under your chest. Your hips should stay down, against the surface that you are lying on. Keep your hip and back muscles relaxed. Hold this position for __________ seconds. Slowly relax your upper body and return to the starting position. Repeat __________ times. Complete this exercise __________ times a day. Strengthening exercises These exercises build strength and endurance in your back. Endurance is the ability to use your muscles for a long time, even after they get tired. Pelvic tilt This exercise strengthens the muscles that lie deep in the abdomen. Lie on your back on a firm bed or the floor with your legs extended. Bend your knees so they are pointing toward the ceiling and your feet are flat on the floor. Tighten your lower abdominal muscles to press your lower back against the floor. This motion will tilt your pelvis so your tailbone points up toward the ceiling instead of pointing to your feet or the floor. To help with this exercise, you may place a small towel under your lower back and try to push your back into the towel. Hold this position for __________ seconds. Let your muscles relax completely before you repeat this exercise. Repeat __________ times. Complete this exercise __________ times a day. Alternating arm and leg raises  Get on your hands  and knees on a firm surface. If you are on a hard floor, you may want to use padding, such as an exercise mat, to cushion your knees. Line up your arms and legs. Your hands should be directly below your shoulders, and your knees should be directly below your hips. Lift your left leg behind you. At the same time, raise your right arm and straighten it in front of you. Do not lift your leg higher than your hip. Do not lift your arm higher  than your shoulder. Keep your abdominal and back muscles tight. Keep your hips facing the ground. Do not arch your back. Keep your balance carefully, and do not hold your breath. Hold this position for __________ seconds. Slowly return to the starting position. Repeat with your right leg and your left arm. Repeat __________ times. Complete this exercise __________ times a day. Abdominal set with straight leg raise  Lie on your back on a firm bed or the floor. Bend one of your knees and keep your other leg straight. Tense your abdominal muscles and lift your straight leg up, 4-6 inches (10-15 cm) off the ground. Keep your abdominal muscles tight and hold this position for __________ seconds. Do not hold your breath. Do not arch your back. Keep it flat against the ground. Keep your abdominal muscles tense as you slowly lower your leg back to the starting position. Repeat with your other leg. Repeat __________ times. Complete this exercise __________ times a day. Single leg lower with bent knees Lie on your back on a firm bed or the floor. Tense your abdominal muscles and lift your feet off the floor, one foot at a time, so your knees and hips are bent in 90-degree angles (right angles). Your knees should be over your hips and your lower legs should be parallel to the floor. Keeping your abdominal muscles tense and your knee bent, slowly lower one of your legs so your toe touches the ground. Lift your leg back up to return to the starting position. Do not hold your breath. Do not let your back arch. Keep your back flat against the ground. Repeat with your other leg. Repeat __________ times. Complete this exercise __________ times a day. Posture and body mechanics Good posture and healthy body mechanics can help to relieve stress in your body's tissues and joints. Body mechanics refers to the movements and positions of your body while you do your daily activities. Posture is part of body  mechanics. Good posture means: Your spine is in its natural S-curve position (neutral). Your shoulders are pulled back slightly. Your head is not tipped forward (neutral). Follow these guidelines to improve your posture and body mechanics in your everyday activities. Standing  When standing, keep your spine neutral and your feet about hip-width apart. Keep a slight bend in your knees. Your ears, shoulders, and hips should line up. When you do a task in which you stand in one place for a long time, place one foot up on a stable object that is 2-4 inches (5-10 cm) high, such as a footstool. This helps keep your spine neutral. Sitting  When sitting, keep your spine neutral and keep your feet flat on the floor. Use a footrest, if necessary, and keep your thighs parallel to the floor. Avoid rounding your shoulders, and avoid tilting your head forward. When working at a desk or a computer, keep your desk at a height where your hands are slightly lower than your elbows. Slide your  chair under your desk so you are close enough to maintain good posture. When working at a computer, place your monitor at a height where you are looking straight ahead and you do not have to tilt your head forward or downward to look at the screen. Resting When lying down and resting, avoid positions that are most painful for you. If you have pain with activities such as sitting, bending, stooping, or squatting, lie in a position in which your body does not bend very much. For example, avoid curling up on your side with your arms and knees near your chest (fetal position). If you have pain with activities such as standing for a long time or reaching with your arms, lie with your spine in a neutral position and bend your knees slightly. Try the following positions: Lying on your side with a pillow between your knees. Lying on your back with a pillow under your knees. Lifting  When lifting objects, keep your feet at least  shoulder-width apart and tighten your abdominal muscles. Bend your knees and hips and keep your spine neutral. It is important to lift using the strength of your legs, not your back. Do not lock your knees straight out. Always ask for help to lift heavy or awkward objects. This information is not intended to replace advice given to you by your health care provider. Make sure you discuss any questions you have with your health care provider. Document Revised: 01/31/2021 Document Reviewed: 01/31/2021 Elsevier Patient Education  [place].

## 2022-02-04 LAB — CBC WITH DIFFERENTIAL/PLATELET
Basophils Absolute: 0.1 10*3/uL (ref 0.0–0.2)
Basos: 2 %
EOS (ABSOLUTE): 0.2 10*3/uL (ref 0.0–0.4)
Eos: 3 %
Hematocrit: 38.5 % (ref 34.0–46.6)
Hemoglobin: 12.7 g/dL (ref 11.1–15.9)
Immature Grans (Abs): 0 10*3/uL (ref 0.0–0.1)
Immature Granulocytes: 0 %
Lymphocytes Absolute: 2 10*3/uL (ref 0.7–3.1)
Lymphs: 32 %
MCH: 30.3 pg (ref 26.6–33.0)
MCHC: 33 g/dL (ref 31.5–35.7)
MCV: 92 fL (ref 79–97)
Monocytes Absolute: 0.4 10*3/uL (ref 0.1–0.9)
Monocytes: 7 %
Neutrophils Absolute: 3.4 10*3/uL (ref 1.4–7.0)
Neutrophils: 56 %
Platelets: 310 10*3/uL (ref 150–450)
RBC: 4.19 x10E6/uL (ref 3.77–5.28)
RDW: 12.7 % (ref 11.7–15.4)
WBC: 6.2 10*3/uL (ref 3.4–10.8)

## 2022-02-04 LAB — LIPID PANEL
Chol/HDL Ratio: 2.3 ratio (ref 0.0–4.4)
Cholesterol, Total: 161 mg/dL (ref 100–199)
HDL: 70 mg/dL (ref >39)
LDL Chol Calc (NIH): 72 mg/dL (ref 0–99)
Triglycerides: 109 mg/dL (ref 0–149)
VLDL Cholesterol Cal: 19 mg/dL (ref 5–40)

## 2022-02-04 LAB — COMPREHENSIVE METABOLIC PANEL
ALT: 12 IU/L (ref 0–32)
AST: 14 IU/L (ref 0–40)
Albumin/Globulin Ratio: 1.7 (ref 1.2–2.2)
Albumin: 4.5 g/dL (ref 3.7–4.7)
Alkaline Phosphatase: 82 IU/L (ref 44–121)
BUN/Creatinine Ratio: 26 (ref 12–28)
BUN: 19 mg/dL (ref 8–27)
Bilirubin Total: <0.2 mg/dL (ref 0.0–1.2)
CO2: 23 mmol/L (ref 20–29)
Calcium: 10.1 mg/dL (ref 8.7–10.3)
Chloride: 102 mmol/L (ref 96–106)
Creatinine, Ser: 0.73 mg/dL (ref 0.57–1.00)
Globulin, Total: 2.6 g/dL (ref 1.5–4.5)
Glucose: 98 mg/dL (ref 70–99)
Potassium: 5.2 mmol/L (ref 3.5–5.2)
Sodium: 138 mmol/L (ref 134–144)
Total Protein: 7.1 g/dL (ref 6.0–8.5)
eGFR: 88 mL/min/{1.73_m2} (ref >59)

## 2022-02-04 LAB — HEMOGLOBIN A1C
Est. average glucose Bld gHb Est-mCnc: 117 mg/dL
Hgb A1c MFr Bld: 5.7 % — ABNORMAL HIGH (ref 4.8–5.6)

## 2022-02-04 LAB — CARDIOVASCULAR RISK ASSESSMENT

## 2022-02-04 LAB — MICROALBUMIN / CREATININE URINE RATIO
Creatinine, Urine: 30 mg/dL
Microalb/Creat Ratio: <10 mg/g creat (ref 0–29)
Microalbumin, Urine: <3 ug/mL

## 2022-02-05 NOTE — Progress Notes (Signed)
A1c 5.7 stable, Microalbuminuria normal, kidney and liver tests normal, Cholesterol normal, CBC normal ?lp

## 2022-02-21 ENCOUNTER — Other Ambulatory Visit: Payer: Self-pay | Admitting: Legal Medicine

## 2022-03-21 ENCOUNTER — Other Ambulatory Visit: Payer: Self-pay | Admitting: Legal Medicine

## 2022-03-21 DIAGNOSIS — E782 Mixed hyperlipidemia: Secondary | ICD-10-CM

## 2022-03-27 DIAGNOSIS — J019 Acute sinusitis, unspecified: Secondary | ICD-10-CM | POA: Diagnosis not present

## 2022-04-03 DIAGNOSIS — K149 Disease of tongue, unspecified: Secondary | ICD-10-CM | POA: Diagnosis not present

## 2022-04-18 DIAGNOSIS — R0981 Nasal congestion: Secondary | ICD-10-CM | POA: Diagnosis not present

## 2022-04-18 DIAGNOSIS — R059 Cough, unspecified: Secondary | ICD-10-CM | POA: Diagnosis not present

## 2022-04-18 DIAGNOSIS — R509 Fever, unspecified: Secondary | ICD-10-CM | POA: Diagnosis not present

## 2022-05-10 IMAGING — MG MM DIGITAL SCREENING BILAT W/ TOMO AND CAD
8 series · 9 of 24 positions shown · non-contrast
Comparison: Previous exam(s).

CLINICAL DATA: Screening.

EXAM:
DIGITAL SCREENING BILATERAL MAMMOGRAM WITH TOMOSYNTHESIS AND CAD
TECHNIQUE: Bilateral screening digital craniocaudal and mediolateral oblique
mammograms were obtained. Bilateral screening digital breast
tomosynthesis was performed. The images were evaluated with
computer-aided detection.

[L CC synth-2D]
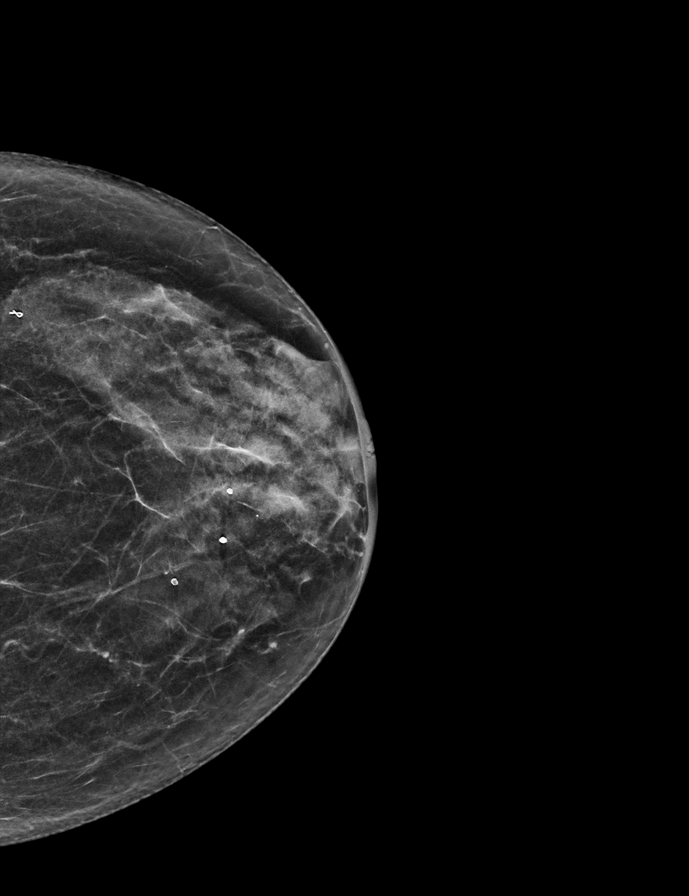

[R MLO synth-2D]
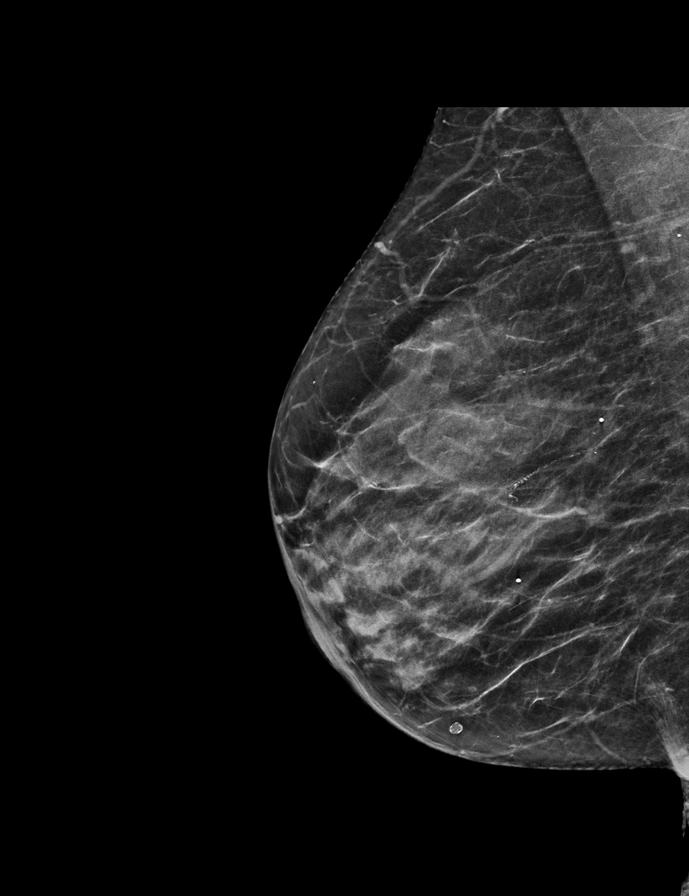

[L MLO synth-2D]
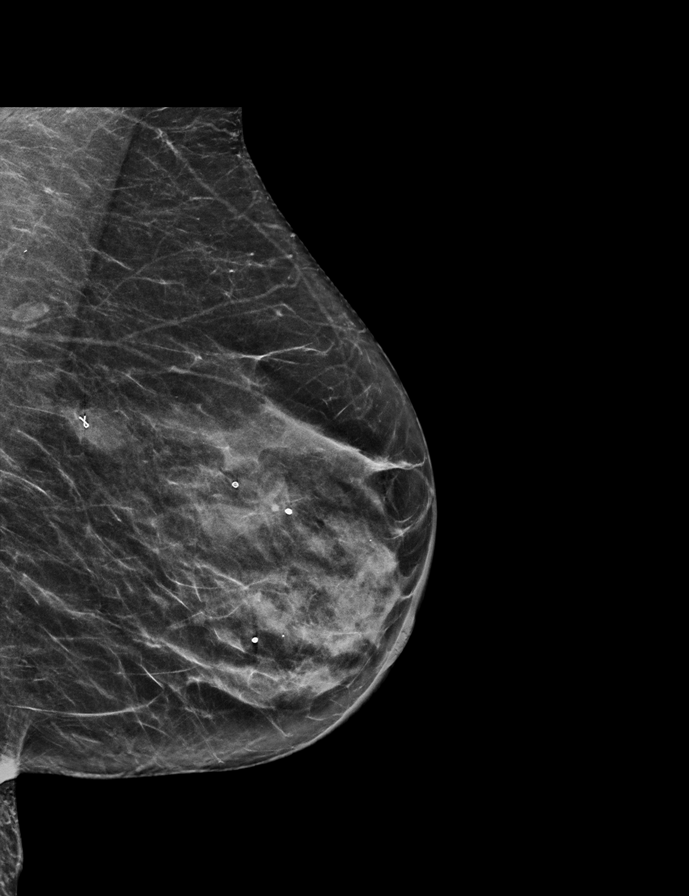

[R CC synth-2D]
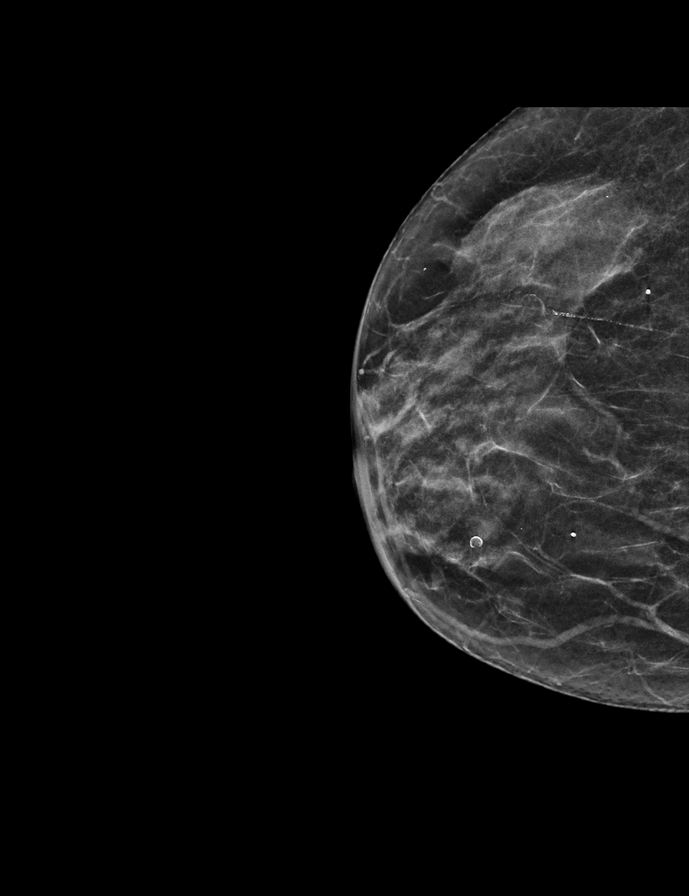

[R MLO tomo · 2 of 50 frames shown]
[frame 17/50]
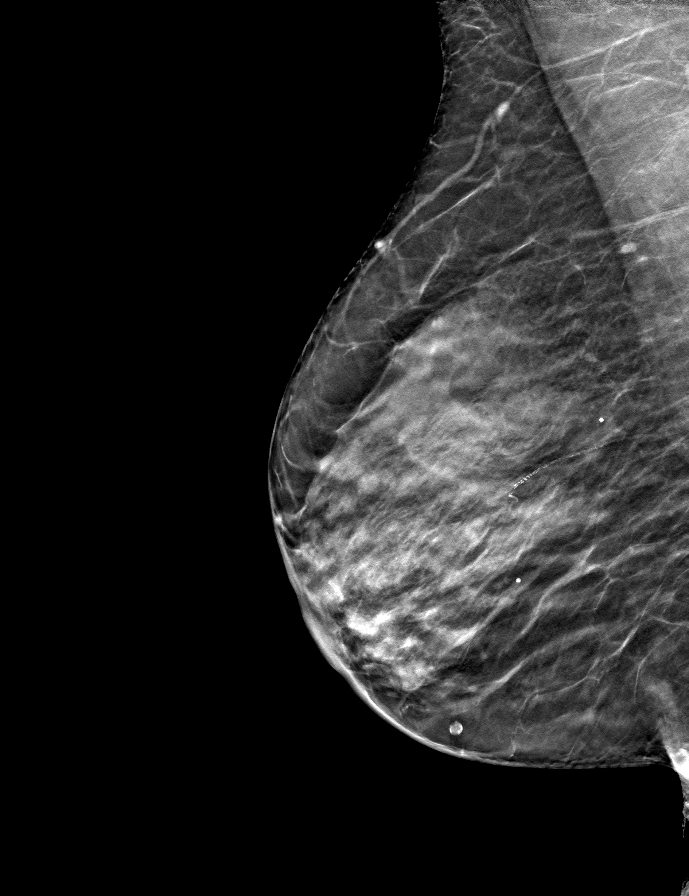
[frame 25/50]
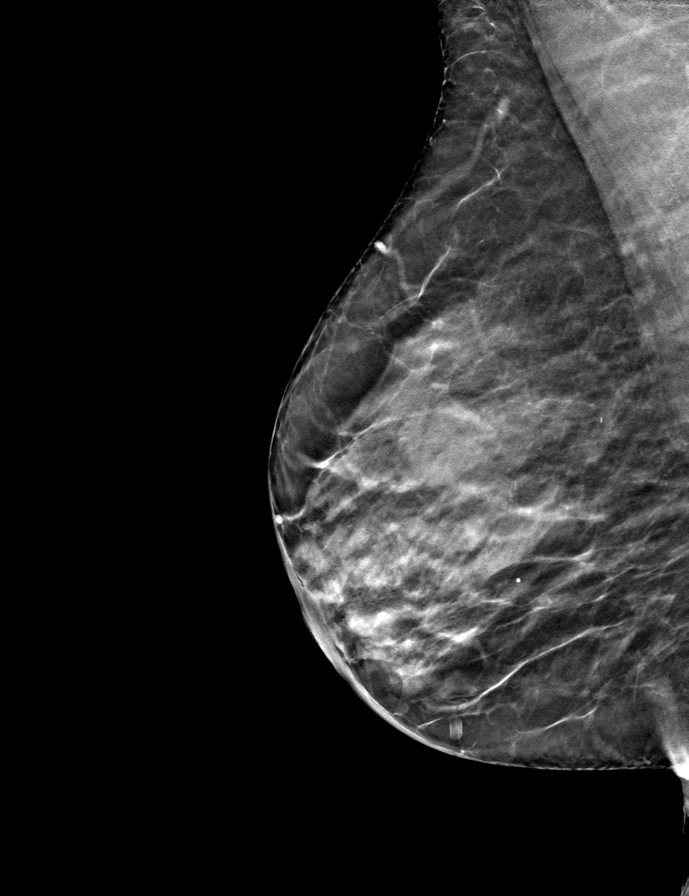

[L MLO tomo · tomo slice 28/55.0]
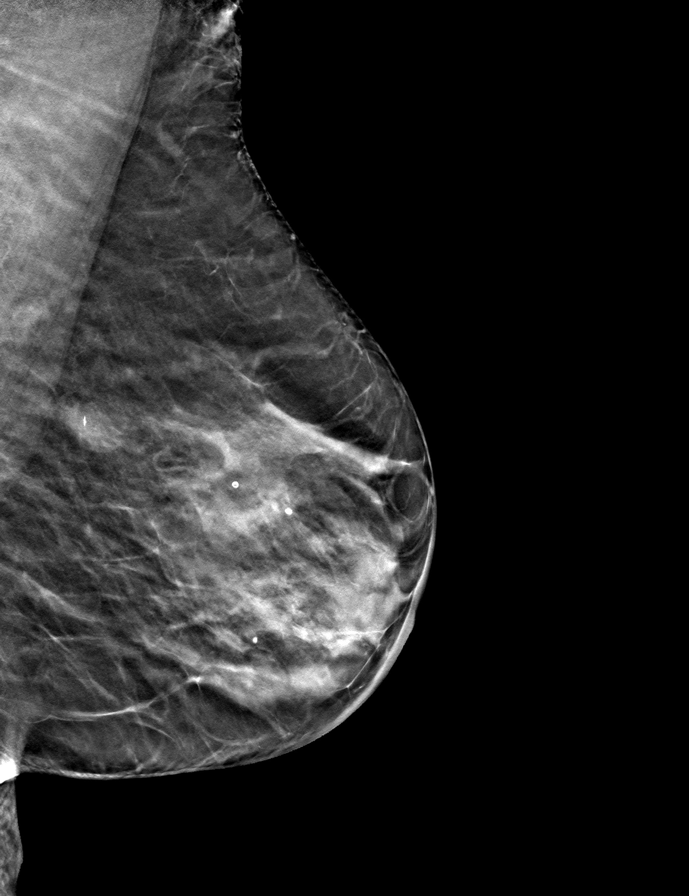

[L CC tomo · tomo slice 25/48.0]
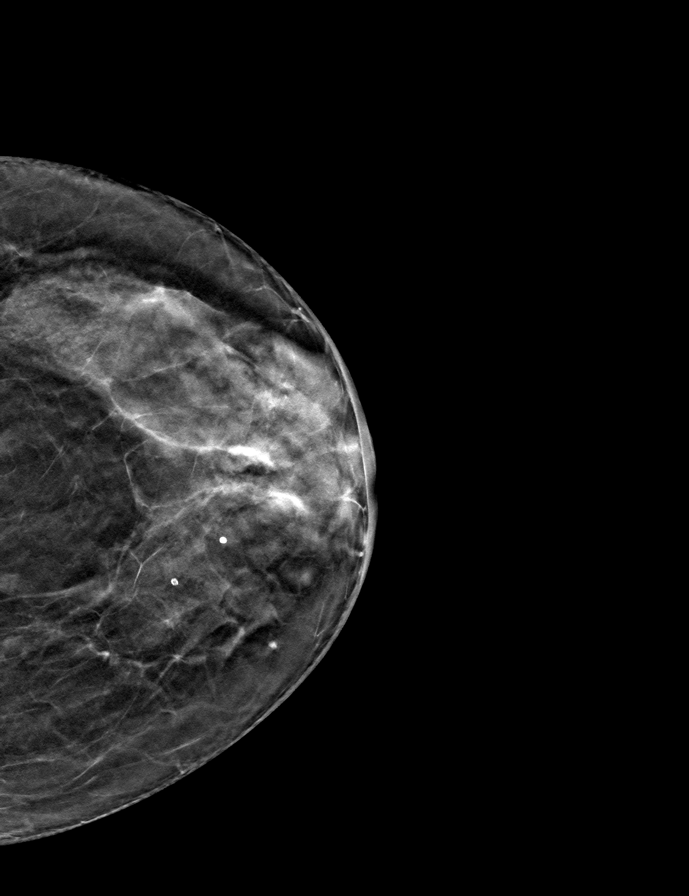

[R CC tomo · tomo slice 24/47.0]
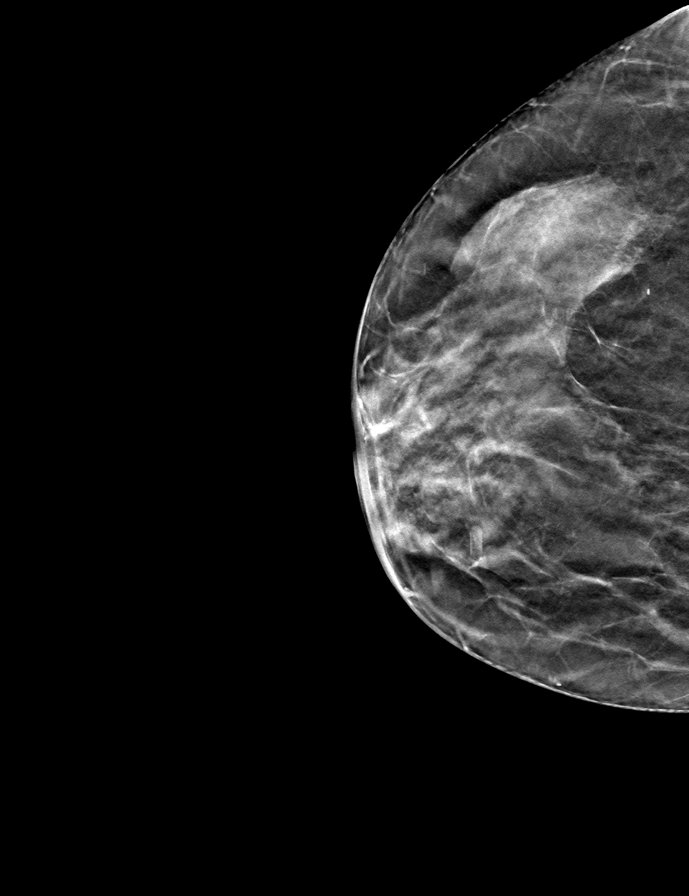

[9 of 24 positions shown; findings below may reference images not displayed]

ACR Breast Density Category c: The breast tissue is heterogeneously
dense, which may obscure small masses.
FINDINGS: There are no findings suspicious for malignancy.
IMPRESSION: No mammographic evidence of malignancy. A result letter of this
screening mammogram will be mailed directly to the patient.

RECOMMENDATION:
Screening mammogram in one year. (Code:Q3-W-BC3)

BI-RADS CATEGORY  1: Negative.

## 2022-05-25 DIAGNOSIS — N3090 Cystitis, unspecified without hematuria: Secondary | ICD-10-CM | POA: Diagnosis not present

## 2022-05-25 DIAGNOSIS — N3001 Acute cystitis with hematuria: Secondary | ICD-10-CM | POA: Diagnosis not present

## 2022-06-01 ENCOUNTER — Ambulatory Visit (INDEPENDENT_AMBULATORY_CARE_PROVIDER_SITE_OTHER): Payer: PPO | Admitting: Physician Assistant

## 2022-06-01 ENCOUNTER — Encounter: Payer: Self-pay | Admitting: Physician Assistant

## 2022-06-01 VITALS — BP 110/70 | HR 92 | Temp 97.6°F | Ht 66.0 in | Wt 131.6 lb

## 2022-06-01 DIAGNOSIS — N3000 Acute cystitis without hematuria: Secondary | ICD-10-CM | POA: Diagnosis not present

## 2022-06-01 LAB — POCT URINALYSIS DIP (CLINITEK)
Blood, UA: NEGATIVE
Glucose, UA: NEGATIVE mg/dL
Leukocytes, UA: NEGATIVE
Nitrite, UA: NEGATIVE
Spec Grav, UA: >=1.03 — AB (ref 1.010–1.025)
Urobilinogen, UA: 1 E.U./dL
pH, UA: 5 (ref 5.0–8.0)

## 2022-06-01 MED ORDER — AMOXICILLIN-POT CLAVULANATE 875-125 MG PO TABS: 1.0000 | ORAL_TABLET | Freq: Two times a day (BID) | ORAL | 0 refills | Status: DC

## 2022-06-01 NOTE — Progress Notes (Signed)
Acute Office Visit  Subjective:    Patient ID: Catherine Stokes, female    DOB: 12-11-1950, 71 y.o.   MRN: [id number]  Chief Complaint  Patient presents with   Urinary Tract Infection    HPI: Patient is in today for complaints of uti - she states she saw urgent care last week and was diagnosed with uti - was given macrobid and then called with culture results and told to change to augmentin which she has started She was having burning and stinging yesterday when appt was made but feeling better today - she was only given 5 days of augmentin Denies nausea, vomiting, fever  Past Medical History:  Diagnosis Date   Prediabetes     Past Surgical History:  Procedure Laterality Date   CHOLECYSTECTOMY  07/24/2012   Procedure: LAPAROSCOPIC CHOLECYSTECTOMY WITH INTRAOPERATIVE CHOLANGIOGRAM;  Surgeon: Pedro Earls, MD;  Location: [facility];  Service: General;  Laterality: N/A;   HERNIA REPAIR  [id number]   umbilical   TUBAL LIGATION  1980    Family History  Problem Relation Age of Onset   Diabetes Mother    Heart disease Mother     Social History   Socioeconomic History   Marital status: Married    Spouse name: Not on file   Number of children: Not on file   Years of education: Not on file   Highest education level: Not on file  Occupational History   Occupation: Farm  Tobacco Use   Smoking status: Never   Smokeless tobacco: Never  Substance and Sexual Activity   Alcohol use: No   Drug use: No   Sexual activity: Not Currently  Other Topics Concern   Not on file  Social History Narrative   Not on file   Social Determinants of Health   Financial Resource Strain: Not on file  Food Insecurity: Not on file  Transportation Needs: Not on file  Physical Activity: Not on file  Stress: Not on file  Social Connections: Not on file  Intimate Partner Violence: Not on file    Outpatient Medications Prior to Visit  Medication Sig Dispense Refill   meloxicam (MOBIC) 15 MG  tablet TAKE 1 TABLET(15 MG) BY MOUTH DAILY 30 tablet 3   metFORMIN (GLUCOPHAGE) 500 MG tablet TAKE 1 TABLET BY MOUTH TWICE DAILY 180 tablet 2   pravastatin (PRAVACHOL) 40 MG tablet TAKE 1 TABLET BY MOUTH EVERY DAY 90 tablet 2   zolpidem (AMBIEN) 10 MG tablet TAKE 1 TABLET(10 MG) BY MOUTH AT BEDTIME AS NEEDED 30 tablet 3   amoxicillin-clavulanate (AUGMENTIN) 875-125 MG tablet Take 1 tablet by mouth 2 (two) times daily.     No facility-administered medications prior to visit.    No Known Allergies  Review of Systems CONSTITUTIONAL: Negative for chills, fatigue, fever, CARDIOVASCULAR: Negative for chest pain, dizziness,  RESPIRATORY: Negative for recent cough and dyspnea.  GASTROINTESTINAL: Negative for abdominal pain, acid reflux symptoms, constipation, diarrhea, nausea and vomiting.  GU - see HPI        Objective:   PHYSICAL EXAM:   VS: BP 110/70 (BP Location: Left Arm, Patient Position: Sitting, Cuff Size: Normal)   Pulse 92   Temp 97.6 F (36.4 C) (Temporal)   Ht _0  (1.676 m)   Wt 131 lb 9.6 oz (59.7 kg)   SpO2 95%   BMI 21.24 kg/m   GEN: Well nourished, well developed, in no acute distress   Cardiac: RRR; no murmurs, rubs, or gallops,no edema -  Respiratory:  normal respiratory rate and pattern with no distress - normal breath sounds with no rales, rhonchi, wheezes or rubs    Office Visit on 06/01/2022  Component Date Value Ref Range Status   Color, UA 06/01/2022 yellow  yellow Final   Clarity, UA 06/01/2022 clear  clear Final   Glucose, UA 06/01/2022 negative  negative mg/dL Final   Bilirubin, UA 06/01/2022 small (A)  negative Final   Ketones, POC UA 06/01/2022 trace (5) (A)  negative mg/dL Final   Spec Grav, UA 06/01/2022 >=1.030 (A)  1.010 - 1.025 Final   Blood, UA 06/01/2022 negative  negative Final   pH, UA 06/01/2022 5.0  5.0 - 8.0 Final   POC PROTEIN,UA 06/01/2022 trace  negative, trace Final   Urobilinogen, UA 06/01/2022 1.0  0.2 or 1.0 E.U./dL Final    Nitrite, UA 06/01/2022 Negative  Negative Final   Leukocytes, UA 06/01/2022 Negative  Negative Final     Health Maintenance Due  Topic Date Due   TETANUS/TDAP  Never done   Zoster Vaccines- Shingrix (1 of 2) Never done   COVID-19 Vaccine (6 - Mixed Product series) 11/18/2020   COLONOSCOPY (Pts 45-38yrs Insurance coverage will need to be confirmed)  01/25/2022    There are no preventive care reminders to display for this patient.   Lab Results  Component Value Date   TSH 1.450 05/13/2021   Lab Results  Component Value Date   WBC 6.2 02/03/2022   HGB 12.7 02/03/2022   HCT 38.5 02/03/2022   MCV 92 02/03/2022   PLT 310 02/03/2022   Lab Results  Component Value Date   NA 138 02/03/2022   K 5.2 02/03/2022   CO2 23 02/03/2022   GLUCOSE 98 02/03/2022   BUN 19 02/03/2022   CREATININE 0.73 02/03/2022   BILITOT <0.2 02/03/2022   ALKPHOS 82 02/03/2022   AST 14 02/03/2022   ALT 12 02/03/2022   PROT 7.1 02/03/2022   ALBUMIN 4.5 02/03/2022   CALCIUM 10.1 02/03/2022   EGFR 88 02/03/2022   Lab Results  Component Value Date   CHOL 161 02/03/2022   Lab Results  Component Value Date   HDL 70 02/03/2022   Lab Results  Component Value Date   LDLCALC 72 02/03/2022   Lab Results  Component Value Date   TRIG 109 02/03/2022   Lab Results  Component Value Date   CHOLHDL 2.3 02/03/2022   Lab Results  Component Value Date   HGBA1C 5.7 (H) 02/03/2022       Assessment & Plan:   Problem List Items Addressed This Visit   None Visit Diagnoses     Acute cystitis without hematuria    -  Primary   Relevant Medications   amoxicillin-clavulanate (AUGMENTIN) 875-125 MG tablet   Other Relevant Orders   Urine Culture   POCT URINALYSIS DIP (CLINITEK) Will continue augmentin since urine is clearing - will await repeat culture      Meds ordered this encounter  Medications   amoxicillin-clavulanate (AUGMENTIN) 875-125 MG tablet    Sig: Take 1 tablet by mouth 2 (two) times  daily.    Dispense:  10 tablet    Refill:  0    Order Specific Question:   Supervising Provider    AnswerShelton Silvas    Orders Placed This Encounter  Procedures   Urine Culture   POCT URINALYSIS DIP (CLINITEK)     Follow-up: Return if symptoms worsen or fail to improve.  An  After Visit Summary was printed and given to the patient.  Yetta Flock [facility] [phone]

## 2022-06-02 LAB — URINE CULTURE: Organism ID, Bacteria: NO GROWTH

## 2022-06-20 NOTE — Progress Notes (Signed)
Subjective:  Patient ID: Catherine Stokes, female    DOB: 1951/05/11  Age: 71 y.o. MRN: [id number]  Chief Complaint  Patient presents with   Prediabetes   Gastroesophageal Reflux    HPI: chronic Patient presents with hyperlipidemia.  Compliance with treatment has been good; patient takes medicines as directed, maintains low cholesterol diet, follows up as directed, and maintains exercise regimen.  Patient is using Pravastatin 40 mg daily without problems.   Prediabetes: She is taking Metformin 500 mg twice a day. On diet.  Insomnia: Taking zolpidem 10 mg daily. Told to use PRN, habit forming  GERD stable  Get Tdap and shingles    Current Outpatient Medications on File Prior to Visit  Medication Sig Dispense Refill   meloxicam (MOBIC) 15 MG tablet TAKE 1 TABLET(15 MG) BY MOUTH DAILY 30 tablet 3   metFORMIN (GLUCOPHAGE) 500 MG tablet TAKE 1 TABLET BY MOUTH TWICE DAILY 180 tablet 2   pravastatin (PRAVACHOL) 40 MG tablet TAKE 1 TABLET BY MOUTH EVERY DAY 90 tablet 2   zolpidem (AMBIEN) 10 MG tablet TAKE 1 TABLET(10 MG) BY MOUTH AT BEDTIME AS NEEDED 30 tablet 3   No current facility-administered medications on file prior to visit.   Past Medical History:  Diagnosis Date   Prediabetes    Past Surgical History:  Procedure Laterality Date   CHOLECYSTECTOMY  07/24/2012   Procedure: LAPAROSCOPIC CHOLECYSTECTOMY WITH INTRAOPERATIVE CHOLANGIOGRAM;  Surgeon: Pedro Earls, MD;  Location: [facility];  Service: General;  Laterality: N/A;   HERNIA REPAIR  [id number]   umbilical   TUBAL LIGATION  1980    Family History  Problem Relation Age of Onset   Diabetes Mother    Heart disease Mother    Social History   Socioeconomic History   Marital status: Married    Spouse name: Not on file   Number of children: Not on file   Years of education: Not on file   Highest education level: Not on file  Occupational History   Occupation: Farm  Tobacco Use   Smoking status: Never   Smokeless  tobacco: Never  Substance and Sexual Activity   Alcohol use: No   Drug use: No   Sexual activity: Not Currently  Other Topics Concern   Not on file  Social History Narrative   Not on file   Social Determinants of Health   Financial Resource Strain: Not on file  Food Insecurity: Not on file  Transportation Needs: Not on file  Physical Activity: Not on file  Stress: Not on file  Social Connections: Not on file    Review of Systems  Constitutional:  Negative for chills, fatigue and fever.  HENT:  Negative for congestion, ear pain and sore throat.   Eyes:  Negative for visual disturbance.  Respiratory:  Negative for cough and shortness of breath.   Cardiovascular:  Negative for chest pain and palpitations.  Gastrointestinal:  Negative for abdominal pain, constipation, diarrhea, nausea and vomiting.  Endocrine: Negative for polydipsia, polyphagia and polyuria.  Genitourinary:  Negative for difficulty urinating and dysuria.  Musculoskeletal:  Negative for arthralgias, back pain and myalgias.  Skin:  Negative for rash.  Neurological:  Negative for headaches.  Psychiatric/Behavioral:  Negative for dysphoric mood. The patient is not nervous/anxious.      Objective:  BP 114/70   Pulse 69   Temp 98.7 F (37.1 C)   Resp 15   Ht '5\' 6"'$  (1.676 m)   Wt 131 lb (59.4 kg)  SpO2 97%   BMI 21.14 kg/m      06/21/2022    8:24 AM 06/01/2022    3:31 PM 02/03/2022    8:14 AM  BP/Weight  Systolic BP 161 096 045  Diastolic BP 70 70 60  Wt. (Lbs) 131 131.6 133  BMI 21.14 kg/m2 21.24 kg/m2 21.47 kg/m2    Physical Exam Vitals reviewed.  Constitutional:      Appearance: Normal appearance. She is normal weight.  HENT:     Head: Normocephalic.     Right Ear: Tympanic membrane normal.     Left Ear: Tympanic membrane normal.     Nose: Nose normal.     Mouth/Throat:     Mouth: Mucous membranes are moist.     Pharynx: Oropharynx is clear.  Eyes:     Extraocular Movements: Extraocular  movements intact.     Conjunctiva/sclera: Conjunctivae normal.     Pupils: Pupils are equal, round, and reactive to light.  Cardiovascular:     Rate and Rhythm: Normal rate and regular rhythm.     Pulses: Normal pulses.     Heart sounds: Normal heart sounds. No murmur heard.    No gallop.  Pulmonary:     Effort: Pulmonary effort is normal. No respiratory distress.     Breath sounds: Normal breath sounds. No wheezing.  Abdominal:     General: Abdomen is flat. Bowel sounds are normal.     Palpations: Abdomen is soft.     Tenderness: There is no abdominal tenderness.  Musculoskeletal:        General: Normal range of motion.     Cervical back: Normal range of motion.     Right lower leg: No edema.     Left lower leg: No edema.  Skin:    General: Skin is warm.     Capillary Refill: Capillary refill takes less than 2 seconds.  Neurological:     General: No focal deficit present.     Mental Status: She is alert and oriented to person, place, and time. Mental status is at baseline.     Gait: Gait normal.  Psychiatric:        Mood and Affect: Mood normal.        Thought Content: Thought content normal.         Lab Results  Component Value Date   WBC 5.8 06/21/2022   HGB 11.9 06/21/2022   HCT 36.4 06/21/2022   PLT 330 06/21/2022   GLUCOSE 95 06/21/2022   CHOL 150 06/21/2022   TRIG 97 06/21/2022   HDL 65 06/21/2022   LDLCALC 67 06/21/2022   ALT 16 06/21/2022   AST 16 06/21/2022   NA 139 06/21/2022   K 4.9 06/21/2022   CL 103 06/21/2022   CREATININE 0.58 06/21/2022   BUN 16 06/21/2022   CO2 24 06/21/2022   TSH 1.450 05/13/2021   HGBA1C 5.6 06/21/2022   MICROALBUR 30 08/31/2020      Assessment & Plan:   Problem List Items Addressed This Visit       Digestive   GERD with esophagitis - Primary (Chronic)   Relevant Orders   CBC with Differential/Platelet (Completed) Plan of care was formulated today.  She is doing well.  A plan of care was formulated using  patient exam, tests and other sources to optimize care using evidence based information.  Recommend no smoking, no eating after supper, avoid fatty foods, elevate Head of bed, avoid tight fitting clothing.  Continue on  OTC medicines..    Chronic constipation     Musculoskeletal and Integument   Osteopenia (Chronic) Patient on vitamin d and calcium     Other   Mixed hyperlipidemia (Chronic)   Relevant Orders   Lipid panel (Completed) AN INDIVIDUAL CARE PLAN for hyperlipidemia/ cholesterol was established and reinforced today.  The patient's status was assessed using clinical findings on exam, lab and other diagnostic tests. The patient's disease status was assessed based on evidence-based guidelines and found to be fair controlled. MEDICATIONS were reviewed. SELF MANAGEMENT GOALS have been discussed and patient's success at attaining the goal of low cholesterol was assessed. RECOMMENDATION given include regular exercise 3 days a week and low cholesterol/low fat diet. CLINICAL SUMMARY including written plan to identify barriers unique to the patient due to social or economic  reasons was discussed.     Adjustment insomnia (Chronic) Husband has cancer using ambien for sleep    Prediabetes   Relevant Orders   Comprehensive metabolic panel (Completed)   Hemoglobin A1c (Completed) Patient on metformin ,     BMI 21.0-21.9, adult Supplement nutrition with protein/calorie supplement with meals to improve nutritional status. Chronic stable BMI   Other Visit Diagnoses     At increased risk of exposure to COVID-19 virus       Relevant Orders   POC COVID-19 (Completed)   Screening for colon cancer       Relevant Orders   Cologuard     .    Orders Placed This Encounter  Procedures   Comprehensive metabolic panel   Lipid panel   CBC with Differential/Platelet   Hemoglobin A1c   Cologuard   Cardiovascular Risk Assessment   POC COVID-19     Follow-up: Return in about 6 months  (around 12/22/2022).  An After Visit Summary was printed and given to the patient.  Reinaldo Meeker, MD [facility] [phone]

## 2022-06-21 ENCOUNTER — Ambulatory Visit (INDEPENDENT_AMBULATORY_CARE_PROVIDER_SITE_OTHER): Payer: PPO | Admitting: Legal Medicine

## 2022-06-21 ENCOUNTER — Encounter: Payer: Self-pay | Admitting: Legal Medicine

## 2022-06-21 VITALS — BP 114/70 | HR 69 | Temp 98.7°F | Resp 15 | Ht 66.0 in | Wt 131.0 lb

## 2022-06-21 DIAGNOSIS — Z6821 Body mass index (BMI) 21.0-21.9, adult: Secondary | ICD-10-CM

## 2022-06-21 DIAGNOSIS — Z1211 Encounter for screening for malignant neoplasm of colon: Secondary | ICD-10-CM

## 2022-06-21 DIAGNOSIS — R7303 Prediabetes: Secondary | ICD-10-CM | POA: Diagnosis not present

## 2022-06-21 DIAGNOSIS — Z9189 Other specified personal risk factors, not elsewhere classified: Secondary | ICD-10-CM | POA: Diagnosis not present

## 2022-06-21 DIAGNOSIS — K2100 Gastro-esophageal reflux disease with esophagitis, without bleeding: Secondary | ICD-10-CM

## 2022-06-21 DIAGNOSIS — F5102 Adjustment insomnia: Secondary | ICD-10-CM | POA: Diagnosis not present

## 2022-06-21 DIAGNOSIS — K5909 Other constipation: Secondary | ICD-10-CM | POA: Diagnosis not present

## 2022-06-21 DIAGNOSIS — E782 Mixed hyperlipidemia: Secondary | ICD-10-CM

## 2022-06-21 DIAGNOSIS — M8580 Other specified disorders of bone density and structure, unspecified site: Secondary | ICD-10-CM | POA: Diagnosis not present

## 2022-06-21 LAB — POC COVID19 BINAXNOW: SARS Coronavirus 2 Ag: NEGATIVE

## 2022-06-22 LAB — LIPID PANEL
Chol/HDL Ratio: 2.3 ratio (ref 0.0–4.4)
Cholesterol, Total: 150 mg/dL (ref 100–199)
HDL: 65 mg/dL (ref >39)
LDL Chol Calc (NIH): 67 mg/dL (ref 0–99)
Triglycerides: 97 mg/dL (ref 0–149)
VLDL Cholesterol Cal: 18 mg/dL (ref 5–40)

## 2022-06-22 LAB — COMPREHENSIVE METABOLIC PANEL
ALT: 16 IU/L (ref 0–32)
AST: 16 IU/L (ref 0–40)
Albumin/Globulin Ratio: 1.7 (ref 1.2–2.2)
Albumin: 4.3 g/dL (ref 3.8–4.8)
Alkaline Phosphatase: 73 IU/L (ref 44–121)
BUN/Creatinine Ratio: 28 (ref 12–28)
BUN: 16 mg/dL (ref 8–27)
Bilirubin Total: 0.3 mg/dL (ref 0.0–1.2)
CO2: 24 mmol/L (ref 20–29)
Calcium: 10.2 mg/dL (ref 8.7–10.3)
Chloride: 103 mmol/L (ref 96–106)
Creatinine, Ser: 0.58 mg/dL (ref 0.57–1.00)
Globulin, Total: 2.5 g/dL (ref 1.5–4.5)
Glucose: 95 mg/dL (ref 70–99)
Potassium: 4.9 mmol/L (ref 3.5–5.2)
Sodium: 139 mmol/L (ref 134–144)
Total Protein: 6.8 g/dL (ref 6.0–8.5)
eGFR: 97 mL/min/{1.73_m2} (ref >59)

## 2022-06-22 LAB — CBC WITH DIFFERENTIAL/PLATELET
Basophils Absolute: 0.1 10*3/uL (ref 0.0–0.2)
Basos: 2 %
EOS (ABSOLUTE): 0.1 10*3/uL (ref 0.0–0.4)
Eos: 1 %
Hematocrit: 36.4 % (ref 34.0–46.6)
Hemoglobin: 11.9 g/dL (ref 11.1–15.9)
Immature Grans (Abs): 0 10*3/uL (ref 0.0–0.1)
Immature Granulocytes: 0 %
Lymphocytes Absolute: 1.6 10*3/uL (ref 0.7–3.1)
Lymphs: 28 %
MCH: 30.2 pg (ref 26.6–33.0)
MCHC: 32.7 g/dL (ref 31.5–35.7)
MCV: 92 fL (ref 79–97)
Monocytes Absolute: 0.4 10*3/uL (ref 0.1–0.9)
Monocytes: 7 %
Neutrophils Absolute: 3.5 10*3/uL (ref 1.4–7.0)
Neutrophils: 62 %
Platelets: 330 10*3/uL (ref 150–450)
RBC: 3.94 x10E6/uL (ref 3.77–5.28)
RDW: 12.8 % (ref 11.7–15.4)
WBC: 5.8 10*3/uL (ref 3.4–10.8)

## 2022-06-22 LAB — HEMOGLOBIN A1C
Est. average glucose Bld gHb Est-mCnc: 114 mg/dL
Hgb A1c MFr Bld: 5.6 % (ref 4.8–5.6)

## 2022-06-22 LAB — CARDIOVASCULAR RISK ASSESSMENT

## 2022-06-22 NOTE — Progress Notes (Signed)
Kidney and liver tests normal, Cholesterol normal, CBC normal, A1c 5.6 good lp

## 2022-07-18 ENCOUNTER — Other Ambulatory Visit: Payer: Self-pay | Admitting: Legal Medicine

## 2022-07-18 DIAGNOSIS — M1990 Unspecified osteoarthritis, unspecified site: Secondary | ICD-10-CM

## 2022-07-20 ENCOUNTER — Encounter: Payer: Self-pay | Admitting: Nurse Practitioner

## 2022-07-20 ENCOUNTER — Ambulatory Visit (INDEPENDENT_AMBULATORY_CARE_PROVIDER_SITE_OTHER): Payer: PPO | Admitting: Nurse Practitioner

## 2022-07-20 VITALS — BP 104/62 | HR 83 | Temp 97.3°F | Ht 66.0 in | Wt 135.0 lb

## 2022-07-20 DIAGNOSIS — H1013 Acute atopic conjunctivitis, bilateral: Secondary | ICD-10-CM

## 2022-07-20 MED ORDER — OFLOXACIN 0.3 % OP SOLN: 2.0000 [drp] | Freq: Four times a day (QID) | OPHTHALMIC | 0 refills | Status: DC

## 2022-07-20 NOTE — Patient Instructions (Addendum)
Discard eye make-up Avoid rubbing eyes Instill Ofloxin eye drops to bilateral eye four times daily May use allergy eye drops such as Opcon-A or Pataday for relief of itching Follow-up as needed   Allergic Conjunctivitis, Adult Allergic conjunctivitis is inflammation of the conjunctiva. The conjunctiva is the thin, clear membrane that covers the white part of the eye and the inner surface of the eyelid. Allergies can affect this layer of the eye. In this condition: The blood vessels in the conjunctiva swell and become irritated. The eyes become red or pink and feel itchy. There is often a watery discharge from the eyes. Allergic conjunctivitis is not contagious. This means it cannot be spread from person to person. The condition can develop at any age and may be outgrown. What are the causes? This condition is caused by allergens. These are things that can cause an allergic reaction in some people. Common allergens include: Outdoor allergens, such as: Pollen, including pollen from grass and weeds. Mold spores. Car fumes. Pollution. Indoor allergens, such as: Dust. Smoke. Mold spores. Proteins in a pet's urine, saliva, or dander. Protein buildup on contact lenses. What increases the risk? You may be more likely to develop this condition if you have a family history of these things: Allergies. Conditions caused by being exposed to allergens, such as: Allergic rhinitis. This is an allergic reaction that affects the nose. Bronchial asthma. This condition affects the large airways in the lungs and makes breathing difficult. Atopic dermatitis (eczema). This is inflammation of the skin that is long-term (chronic). What are the signs or symptoms? Symptoms of this condition include eyes that are itchy, red, watery, or puffy. Your eyes may also: Sting or burn. Have clear fluid draining from them. Have thick mucus discharge and pain (vernal conjunctivitis). This happens in severe cases. How  is this diagnosed? This condition may be diagnosed based on: Your medical history. A physical exam, including an eye exam. Tests of the fluid draining from your eyes to rule out other causes. Other tests to confirm the diagnosis, including: Testing for allergies. The skin may be pricked with a tiny needle. The pricked area is then exposed to small amounts of allergens. Testing for other eye conditions. Tests may include: Blood tests. Tissue scrapings from your eyelid. The tissue is then checked under a microscope. How is this treated? Treatment for this condition may include: Using cold, wet cloths (cold compresses) to soothe itching and swelling. Washing your face and hair. Also, washing your clothes often to remove allergens. Using eye drops. These may be prescription or over-the-counter. You may need to try different types to see which one works best for you. Examples include: Eye drops that wash allergens out of the eyes (preservative-free artificial tears). Eye drops that block the allergic reaction (antihistamine). Eye drops that reduce swelling and irritation (anti-inflammatory). Steroid eye drops, which may be given if other treatments have not worked. Oral antihistamine medicines. These are medicines taken by mouth to lessen your allergic reaction. You may need these if eye drops do not help or are difficult to use. An air purifier at home and work. Wraparound sunglasses. This may help to decrease the amount of allergens reaching the eye. Not wearing contact lenses until symptoms improve, if the condition was caused by contact lenses. Change to daily wear disposable contact lenses, if possible. Follow these instructions at home: Eye care Apply a clean, cold compress to your eyes for 10-20 minutes, 3-4 times a day. Do not touch or rub  your eyes. Do not wear contact lenses until the inflammation is gone. Wear glasses instead. Do not wear eye makeup until the inflammation is  gone. General instructions Avoid known allergens whenever possible. Take or apply over-the-counter and prescription medicines only as told by your health care provider. These include any eye drops. Drink enough fluid to keep your urine pale yellow. Keep all follow-up visits. Contact a health care provider if: Your symptoms get worse or do not get better with treatment. You have mild eye pain. You become sensitive to light. You have spots or blisters on your eyes. You have a fever. Get help right away if: You have redness, swelling, or other symptoms in only one eye. Your vision is blurred or you have other vision changes. You have pus draining from your eyes. You have severe eye pain. Summary Allergic conjunctivitis is inflammation of the eye that is caused by allergens. It affects the clear membrane that covers the white part of the eye and the inner surface of the eyelid. It often causes eye itching, redness, and a watery discharge. Take or apply over-the-counter and prescription medicines only as told by your health care provider. These include eye drops. Do not touch or rub your eyes. Contact a health care provider if your symptoms get worse or do not get better with treatment. This information is not intended to replace advice given to you by your health care provider. Make sure you discuss any questions you have with your health care provider. Document Revised: 01/23/2022 Document Reviewed: 01/23/2022 Elsevier Patient Education  [place].

## 2022-07-20 NOTE — Progress Notes (Signed)
Acute Office Visit  Subjective:    Patient ID: Catherine Stokes, female    DOB: 1951/02/21, 71 y.o.   MRN: [id number]  Chief Complaint  Patient presents with   Itchy Eyes    HPI: Patient is in today for red, itchy, watery eyes x 2 weeks. States left eye was matted shut when awakening this morning.Treatment includes Visine eye drops with minimal relief.  Past Medical History:  Diagnosis Date   Prediabetes     Past Surgical History:  Procedure Laterality Date   CHOLECYSTECTOMY  07/24/2012   Procedure: LAPAROSCOPIC CHOLECYSTECTOMY WITH INTRAOPERATIVE CHOLANGIOGRAM;  Surgeon: Pedro Earls, MD;  Location: [facility];  Service: General;  Laterality: N/A;   HERNIA REPAIR  7858   umbilical   TUBAL LIGATION  1980    Family History  Problem Relation Age of Onset   Diabetes Mother    Heart disease Mother     Social History   Socioeconomic History   Marital status: Married    Spouse name: Not on file   Number of children: Not on file   Years of education: Not on file   Highest education level: Not on file  Occupational History   Occupation: Farm  Tobacco Use   Smoking status: Never   Smokeless tobacco: Never  Substance and Sexual Activity   Alcohol use: No   Drug use: No   Sexual activity: Not Currently  Other Topics Concern   Not on file  Social History Narrative   Not on file   Social Determinants of Health   Financial Resource Strain: Not on file  Food Insecurity: Not on file  Transportation Needs: Not on file  Physical Activity: Not on file  Stress: Not on file  Social Connections: Not on file  Intimate Partner Violence: Not on file    Outpatient Medications Prior to Visit  Medication Sig Dispense Refill   meloxicam (MOBIC) 15 MG tablet TAKE 1 TABLET(15 MG) BY MOUTH DAILY 30 tablet 3   metFORMIN (GLUCOPHAGE) 500 MG tablet TAKE 1 TABLET BY MOUTH TWICE DAILY 180 tablet 2   pravastatin (PRAVACHOL) 40 MG tablet TAKE 1 TABLET BY MOUTH EVERY DAY 90 tablet 2    zolpidem (AMBIEN) 10 MG tablet TAKE 1 TABLET(10 MG) BY MOUTH AT BEDTIME AS NEEDED 30 tablet 3   No facility-administered medications prior to visit.    No Known Allergies  Review of Systems See pertinent positives and negatives per HPI.     Objective:    Physical Exam Vitals reviewed.  Constitutional:      Appearance: Normal appearance.  Eyes:     General: Gaze aligned appropriately.     Extraocular Movements: Extraocular movements intact.     Conjunctiva/sclera:     Right eye: Right conjunctiva is injected.     Left eye: Left conjunctiva is injected. Exudate present.  Neurological:     Mental Status: She is alert.     BP 104/62   Pulse 83   Temp (!) 97.3 F (36.3 C)   Ht _0  (1.676 m)   Wt 135 lb (61.2 kg)   SpO2 99%   BMI 21.79 kg/m   Wt Readings from Last 3 Encounters:  07/20/22 135 lb (61.2 kg)  06/21/22 131 lb (59.4 kg)  06/01/22 131 lb 9.6 oz (59.7 kg)    Health Maintenance Due  Topic Date Due   TETANUS/TDAP  Never done   Zoster Vaccines- Shingrix (1 of 2) Never done   COVID-19 Vaccine (  6 - Mixed Product series) 11/18/2020   COLONOSCOPY (Pts 45-24yr Insurance coverage will need to be confirmed)  01/25/2022   INFLUENZA VACCINE  06/27/2022    Lab Results  Component Value Date   TSH 1.450 05/13/2021   Lab Results  Component Value Date   WBC 5.8 06/21/2022   HGB 11.9 06/21/2022   HCT 36.4 06/21/2022   MCV 92 06/21/2022   PLT 330 06/21/2022   Lab Results  Component Value Date   NA 139 06/21/2022   K 4.9 06/21/2022   CO2 24 06/21/2022   GLUCOSE 95 06/21/2022   BUN 16 06/21/2022   CREATININE 0.58 06/21/2022   BILITOT 0.3 06/21/2022   ALKPHOS 73 06/21/2022   AST 16 06/21/2022   ALT 16 06/21/2022   PROT 6.8 06/21/2022   ALBUMIN 4.3 06/21/2022   CALCIUM 10.2 06/21/2022   EGFR 97 06/21/2022   Lab Results  Component Value Date   CHOL 150 06/21/2022   Lab Results  Component Value Date   HDL 65 06/21/2022   Lab Results  Component  Value Date   LDLCALC 67 06/21/2022   Lab Results  Component Value Date   TRIG 97 06/21/2022   Lab Results  Component Value Date   CHOLHDL 2.3 06/21/2022   Lab Results  Component Value Date   HGBA1C 5.6 06/21/2022       Assessment & Plan:   1. Allergic conjunctivitis of both eyes - ofloxacin (OCUFLOX) 0.3 % ophthalmic solution; Place 2 drops into both eyes 4 (four) times daily.  Dispense: 5 mL; Refill: 0    Discard eye make-up Avoid rubbing eyes Instill Ofloxin eye drops to bilateral eye four times daily May use allergy eye drops such as Opcon-A or Pataday for relief of itching Follow-up as needed    Follow-up: PRN  An After Visit Summary was printed and given to the patient.  I, SRip Harbour NP, have reviewed all documentation for this visit. The documentation on 07/20/22 for the exam, diagnosis, procedures, and orders are all accurate and complete.    Signed, SRip Harbour NP [facility]([phone]

## 2022-07-25 DIAGNOSIS — H1013 Acute atopic conjunctivitis, bilateral: Secondary | ICD-10-CM | POA: Diagnosis not present

## 2022-07-25 DIAGNOSIS — H16223 Keratoconjunctivitis sicca, not specified as Sjogren's, bilateral: Secondary | ICD-10-CM | POA: Diagnosis not present

## 2022-08-01 DIAGNOSIS — H1013 Acute atopic conjunctivitis, bilateral: Secondary | ICD-10-CM | POA: Diagnosis not present

## 2022-08-01 DIAGNOSIS — H16223 Keratoconjunctivitis sicca, not specified as Sjogren's, bilateral: Secondary | ICD-10-CM | POA: Diagnosis not present

## 2022-08-23 DIAGNOSIS — E119 Type 2 diabetes mellitus without complications: Secondary | ICD-10-CM | POA: Diagnosis not present

## 2022-08-23 DIAGNOSIS — H1013 Acute atopic conjunctivitis, bilateral: Secondary | ICD-10-CM | POA: Diagnosis not present

## 2022-08-23 DIAGNOSIS — H524 Presbyopia: Secondary | ICD-10-CM | POA: Diagnosis not present

## 2022-08-23 LAB — HM DIABETES EYE EXAM

## 2022-08-24 ENCOUNTER — Encounter: Payer: Self-pay | Admitting: Legal Medicine

## 2022-09-06 DIAGNOSIS — H524 Presbyopia: Secondary | ICD-10-CM | POA: Diagnosis not present

## 2022-09-18 ENCOUNTER — Other Ambulatory Visit: Payer: Self-pay | Admitting: Legal Medicine

## 2022-11-07 ENCOUNTER — Ambulatory Visit (INDEPENDENT_AMBULATORY_CARE_PROVIDER_SITE_OTHER): Payer: PPO | Admitting: Legal Medicine

## 2022-11-07 ENCOUNTER — Encounter: Payer: Self-pay | Admitting: Legal Medicine

## 2022-11-07 VITALS — BP 110/64 | HR 72 | Temp 97.3°F | Resp 14 | Ht 66.0 in | Wt 138.0 lb

## 2022-11-07 DIAGNOSIS — J0100 Acute maxillary sinusitis, unspecified: Secondary | ICD-10-CM

## 2022-11-07 MED ORDER — AMOXICILLIN-POT CLAVULANATE 875-125 MG PO TABS: 1.0000 | ORAL_TABLET | Freq: Two times a day (BID) | ORAL | 0 refills | Status: DC

## 2022-11-07 MED ORDER — PREDNISONE 10 MG (21) PO TBPK: ORAL_TABLET | ORAL | 0 refills | Status: DC

## 2022-11-07 NOTE — Progress Notes (Signed)
Acute Office Visit  Subjective:    Patient ID: Catherine Stokes, female    DOB: 04-Aug-1951, 71 y.o.   MRN: [id number]  Chief Complaint  Patient presents with   Sinusitis    HPI: Patient is in today for sinus congestion, postnasal drip and headache since 2-3 days ago. She denies fever, chills, SOB and chest pain. Pain bitemporal and periorbidal for one week.  Using OTC medicines.  Past Medical History:  Diagnosis Date   Prediabetes     Past Surgical History:  Procedure Laterality Date   CHOLECYSTECTOMY  07/24/2012   Procedure: LAPAROSCOPIC CHOLECYSTECTOMY WITH INTRAOPERATIVE CHOLANGIOGRAM;  Surgeon: Pedro Earls, MD;  Location: [facility];  Service: General;  Laterality: N/A;   HERNIA REPAIR  1497   umbilical   TUBAL LIGATION  1980    Family History  Problem Relation Age of Onset   Diabetes Mother    Heart disease Mother     Social History   Socioeconomic History   Marital status: Married    Spouse name: Not on file   Number of children: Not on file   Years of education: Not on file   Highest education level: Not on file  Occupational History   Occupation: Farm  Tobacco Use   Smoking status: Never   Smokeless tobacco: Never  Substance and Sexual Activity   Alcohol use: No   Drug use: No   Sexual activity: Not Currently  Other Topics Concern   Not on file  Social History Narrative   Not on file   Social Determinants of Health   Financial Resource Strain: Not on file  Food Insecurity: Not on file  Transportation Needs: Not on file  Physical Activity: Not on file  Stress: Not on file  Social Connections: Not on file  Intimate Partner Violence: Not on file    Outpatient Medications Prior to Visit  Medication Sig Dispense Refill   meloxicam (MOBIC) 15 MG tablet TAKE 1 TABLET(15 MG) BY MOUTH DAILY 30 tablet 3   metFORMIN (GLUCOPHAGE) 500 MG tablet TAKE 1 TABLET BY MOUTH TWICE DAILY 180 tablet 2   pravastatin (PRAVACHOL) 40 MG tablet TAKE 1 TABLET BY  MOUTH EVERY DAY 90 tablet 2   zolpidem (AMBIEN) 10 MG tablet TAKE 1 TABLET(10 MG) BY MOUTH AT BEDTIME AS NEEDED 30 tablet 2   ofloxacin (OCUFLOX) 0.3 % ophthalmic solution Place 2 drops into both eyes 4 (four) times daily. 5 mL 0   No facility-administered medications prior to visit.    No Known Allergies  Review of Systems  Constitutional:  Negative for chills, fatigue and fever.  HENT:  Positive for congestion, postnasal drip, sinus pressure and sinus pain. Negative for ear pain and sore throat.   Eyes:  Negative for visual disturbance.  Respiratory:  Negative for cough and shortness of breath.   Cardiovascular:  Negative for chest pain and palpitations.  Gastrointestinal:  Negative for abdominal pain, constipation, diarrhea, nausea and vomiting.  Endocrine: Negative for polydipsia, polyphagia and polyuria.  Genitourinary:  Negative for difficulty urinating and dysuria.  Musculoskeletal:  Negative for arthralgias, back pain and myalgias.  Skin:  Negative for rash.  Neurological:  Positive for headaches.  Psychiatric/Behavioral:  Negative for dysphoric mood. The patient is not nervous/anxious.        Objective:    Physical Exam Vitals reviewed.  Constitutional:      General: She is not in acute distress.    Appearance: Normal appearance.  HENT:  Head: Normocephalic.     Right Ear: Tympanic membrane normal.     Left Ear: Tympanic membrane normal.     Nose: Congestion present.     Mouth/Throat:     Mouth: Mucous membranes are moist.     Pharynx: Oropharynx is clear.  Eyes:     Extraocular Movements: Extraocular movements intact.     Conjunctiva/sclera: Conjunctivae normal.     Pupils: Pupils are equal, round, and reactive to light.  Cardiovascular:     Rate and Rhythm: Normal rate and regular rhythm.     Pulses: Normal pulses.     Heart sounds: No murmur heard.    No gallop.  Pulmonary:     Effort: Pulmonary effort is normal. No respiratory distress.     Breath  sounds: No wheezing.  Abdominal:     General: Abdomen is flat. Bowel sounds are normal. There is no distension.     Palpations: Abdomen is soft.     Tenderness: There is no abdominal tenderness.  Neurological:     Mental Status: She is alert.     BP 110/64   Pulse 72   Temp (!) 97.3 F (36.3 C)   Resp 14   Ht _0  (1.676 m)   Wt 138 lb (62.6 kg)   SpO2 98%   BMI 22.27 kg/m  Wt Readings from Last 3 Encounters:  11/07/22 138 lb (62.6 kg)  07/20/22 135 lb (61.2 kg)  06/21/22 131 lb (59.4 kg)    Health Maintenance Due  Topic Date Due   Medicare Annual Wellness (AWV)  Never done   DTaP/Tdap/Td (1 - Tdap) Never done   Zoster Vaccines- Shingrix (1 of 2) Never done   COLONOSCOPY (Pts 45-50yr Insurance coverage will need to be confirmed)  01/25/2022   INFLUENZA VACCINE  06/27/2022   COVID-19 Vaccine (6 - 2023-24 season) 07/28/2022    There are no preventive care reminders to display for this patient.   Lab Results  Component Value Date   TSH 1.450 05/13/2021   Lab Results  Component Value Date   WBC 5.8 06/21/2022   HGB 11.9 06/21/2022   HCT 36.4 06/21/2022   MCV 92 06/21/2022   PLT 330 06/21/2022   Lab Results  Component Value Date   NA 139 06/21/2022   K 4.9 06/21/2022   CO2 24 06/21/2022   GLUCOSE 95 06/21/2022   BUN 16 06/21/2022   CREATININE 0.58 06/21/2022   BILITOT 0.3 06/21/2022   ALKPHOS 73 06/21/2022   AST 16 06/21/2022   ALT 16 06/21/2022   PROT 6.8 06/21/2022   ALBUMIN 4.3 06/21/2022   CALCIUM 10.2 06/21/2022   EGFR 97 06/21/2022   Lab Results  Component Value Date   CHOL 150 06/21/2022   Lab Results  Component Value Date   HDL 65 06/21/2022   Lab Results  Component Value Date   LDLCALC 67 06/21/2022   Lab Results  Component Value Date   TRIG 97 06/21/2022   Lab Results  Component Value Date   CHOLHDL 2.3 06/21/2022   Lab Results  Component Value Date   HGBA1C 5.6 06/21/2022       Assessment & Plan:   Problem List  Items Addressed This Visit   None Visit Diagnoses     Acute non-recurrent maxillary sinusitis    -  Primary   Relevant Medications   amoxicillin-clavulanate (AUGMENTIN) 875-125 MG tablet   predniSONE (STERAPRED UNI-PAK 21 TAB) 10 MG (21) TBPK tablet Sterapred pack and  augmentin for infection      Meds ordered this encounter  Medications   amoxicillin-clavulanate (AUGMENTIN) 875-125 MG tablet    Sig: Take 1 tablet by mouth 2 (two) times daily.    Dispense:  20 tablet    Refill:  0   predniSONE (STERAPRED UNI-PAK 21 TAB) 10 MG (21) TBPK tablet    Sig: Use as directed    Dispense:  21 tablet    Refill:  0       Follow-up: Return if symptoms worsen or fail to improve.  An After Visit Summary was printed and given to the patient.  Reinaldo Meeker, MD [facility] [phone]

## 2022-11-30 ENCOUNTER — Other Ambulatory Visit: Payer: Self-pay | Admitting: Obstetrics and Gynecology

## 2022-11-30 DIAGNOSIS — Z1231 Encounter for screening mammogram for malignant neoplasm of breast: Secondary | ICD-10-CM

## 2022-12-15 ENCOUNTER — Ambulatory Visit
Admission: RE | Admit: 2022-12-15 | Discharge: 2022-12-15 | Disposition: A | Discharge status: Home / Self Care | Payer: PPO | Source: Ambulatory Visit | Attending: Obstetrics and Gynecology | Admitting: Obstetrics and Gynecology

## 2022-12-15 DIAGNOSIS — Z1231 Encounter for screening mammogram for malignant neoplasm of breast: Secondary | ICD-10-CM

## 2022-12-29 ENCOUNTER — Ambulatory Visit (INDEPENDENT_AMBULATORY_CARE_PROVIDER_SITE_OTHER): Payer: PPO | Admitting: Physician Assistant

## 2022-12-29 ENCOUNTER — Encounter: Payer: Self-pay | Admitting: Physician Assistant

## 2022-12-29 VITALS — BP 112/60 | HR 88 | Temp 97.2°F | Resp 16 | Ht 66.0 in | Wt 133.0 lb

## 2022-12-29 DIAGNOSIS — M1990 Unspecified osteoarthritis, unspecified site: Secondary | ICD-10-CM

## 2022-12-29 DIAGNOSIS — E782 Mixed hyperlipidemia: Secondary | ICD-10-CM

## 2022-12-29 DIAGNOSIS — F5102 Adjustment insomnia: Secondary | ICD-10-CM | POA: Diagnosis not present

## 2022-12-29 DIAGNOSIS — R0789 Other chest pain: Secondary | ICD-10-CM

## 2022-12-29 DIAGNOSIS — R7303 Prediabetes: Secondary | ICD-10-CM

## 2022-12-29 NOTE — Progress Notes (Signed)
Established Patient Office Visit  Subjective:  Patient ID: Catherine Stokes, female    DOB: [birth year]  Age: 72 y.o. MRN: [id number] PT NEW TO ME - DR Henrene Pastor PT CC:  Chief Complaint  Patient presents with   Hyperlipidemia    HPI Catherine Stokes presents for chronic follow up  Pt states she has had a history of hyperlipidemia for about 2 years.  She is currently on pravastatin '40mg'$  qd.  Due for labwork  Pt with history of arthritis - uses meloxicam as needed  Pt states that she has a history of prediabetes and never been told she was diabetic however she has been on metformin for several years.  States does not check glucose regularly  Pt with history of insomina- takes [place] and has been on this medication several years  Pt states that she has felt very gassy with mildly upset stomach that started last night .  She states the pressure is 'all over' including upper chest/midepigastric area/ and lower stomach.  She states if she sits still she has no pain - when she bends over she feels it - states she has had some belching and passing gas but 'not enough' She denies chest pain/dyspnea/edema - no history of cardiac disease Pt states she actually ate bacon, eggs and toast for breakfast and not nauseated Past Medical History:  Diagnosis Date   Prediabetes     Past Surgical History:  Procedure Laterality Date   CHOLECYSTECTOMY  07/24/2012   Procedure: LAPAROSCOPIC CHOLECYSTECTOMY WITH INTRAOPERATIVE CHOLANGIOGRAM;  Surgeon: Pedro Earls, MD;  Location: [facility];  Service: General;  Laterality: N/A;   HERNIA REPAIR  1696   umbilical   TUBAL LIGATION  1980    Family History  Problem Relation Age of Onset   Diabetes Mother    Heart disease Mother     Social History   Socioeconomic History   Marital status: Married    Spouse name: Not on file   Number of children: Not on file   Years of education: Not on file   Highest education level: Not on file  Occupational  History   Occupation: Farm  Tobacco Use   Smoking status: Never   Smokeless tobacco: Never  Substance and Sexual Activity   Alcohol use: No   Drug use: No   Sexual activity: Not Currently  Other Topics Concern   Not on file  Social History Narrative   Not on file   Social Determinants of Health   Financial Resource Strain: Not on file  Food Insecurity: Not on file  Transportation Needs: Not on file  Physical Activity: Not on file  Stress: Not on file  Social Connections: Not on file  Intimate Partner Violence: Not on file     Current Outpatient Medications:    meloxicam (MOBIC) 15 MG tablet, TAKE 1 TABLET(15 MG) BY MOUTH DAILY, Disp: 30 tablet, Rfl: 3   metFORMIN (GLUCOPHAGE) 500 MG tablet, TAKE 1 TABLET BY MOUTH TWICE DAILY, Disp: 180 tablet, Rfl: 2   pravastatin (PRAVACHOL) 40 MG tablet, TAKE 1 TABLET BY MOUTH EVERY DAY, Disp: 90 tablet, Rfl: 2   zolpidem (AMBIEN) 10 MG tablet, TAKE 1 TABLET(10 MG) BY MOUTH AT BEDTIME AS NEEDED, Disp: 30 tablet, Rfl: 2   No Known Allergies  ROS CONSTITUTIONAL: Negative for chills, fatigue, fever, unintentional weight gain and unintentional weight loss.  E/N/T: Negative for ear pain, nasal congestion and sore throat.  CARDIOVASCULAR: Negative for chest pain, dizziness, palpitations and  pedal edema.  RESPIRATORY: Negative for recent cough and dyspnea.  GASTROINTESTINAL: see HPI MSK: Negative for arthralgias and myalgias.  INTEGUMENTARY: Negative for rash.         Objective:    PHYSICAL EXAM:   VS: BP 112/60   Pulse 88   Temp (!) 97.2 F (36.2 C)   Resp 16   Ht '5\' 6"'$  (1.676 m)   Wt 133 lb (60.3 kg)   SpO2 98%   BMI 21.47 kg/m   GEN: Well nourished, well developed, in no acute distress  Cardiac: RRR; no murmurs, rubs, or gallops,no edema -  Respiratory:  normal respiratory rate and pattern with no distress - normal breath sounds with no rales, rhonchi, wheezes or rubs GI: normal bowel sounds, no masses or tenderness MS:  no deformity or atrophy  Skin: warm and dry, no rash  Psych: euthymic mood, appropriate affect and demeanor  EKG normal    Health Maintenance Due  Topic Date Due   Medicare Annual Wellness (AWV)  Never done   DTaP/Tdap/Td (1 - Tdap) Never done   INFLUENZA VACCINE  06/27/2022    There are no preventive care reminders to display for this patient.  Lab Results  Component Value Date   TSH 1.450 05/13/2021   Lab Results  Component Value Date   WBC 5.8 06/21/2022   HGB 11.9 06/21/2022   HCT 36.4 06/21/2022   MCV 92 06/21/2022   PLT 330 06/21/2022   Lab Results  Component Value Date   NA 139 06/21/2022   K 4.9 06/21/2022   CO2 24 06/21/2022   GLUCOSE 95 06/21/2022   BUN 16 06/21/2022   CREATININE 0.58 06/21/2022   BILITOT 0.3 06/21/2022   ALKPHOS 73 06/21/2022   AST 16 06/21/2022   ALT 16 06/21/2022   PROT 6.8 06/21/2022   ALBUMIN 4.3 06/21/2022   CALCIUM 10.2 06/21/2022   EGFR 97 06/21/2022   Lab Results  Component Value Date   CHOL 150 06/21/2022   Lab Results  Component Value Date   HDL 65 06/21/2022   Lab Results  Component Value Date   LDLCALC 67 06/21/2022   Lab Results  Component Value Date   TRIG 97 06/21/2022   Lab Results  Component Value Date   CHOLHDL 2.3 06/21/2022   Lab Results  Component Value Date   HGBA1C 5.6 06/21/2022      Assessment & Plan:   Problem List Items Addressed This Visit               Musculoskeletal and Integument   Arthritis Continue mobic as needed     Other   Mixed hyperlipidemia (Chronic) Return for fasting labwork Continue pravastatin   Adjustment insomnia (Chronic) Continue ambien as needed   Prediabetes Continue glucophage Recommend low carb/low sugar diet   Atypical chest pain - Primary Pt sent to hospital for stat Tropinin I - was negative Recommend to follow up if symptoms persist/worsen  Pt to return next week for fasting labwork    No orders of the defined types were placed in this  encounter.   Follow-up: No follow-ups on file.    SARA R Infant Zink, PA-C

## 2023-01-01 NOTE — Addendum Note (Signed)
Addended by: Marge Duncans on: 01/01/2023 04:15 PM   Modules accepted: Orders

## 2023-01-02 ENCOUNTER — Other Ambulatory Visit: Payer: PPO

## 2023-01-09 ENCOUNTER — Other Ambulatory Visit: Payer: PPO

## 2023-01-09 ENCOUNTER — Other Ambulatory Visit: Payer: Self-pay

## 2023-01-09 DIAGNOSIS — E782 Mixed hyperlipidemia: Secondary | ICD-10-CM

## 2023-01-10 ENCOUNTER — Other Ambulatory Visit: Payer: PPO

## 2023-01-10 DIAGNOSIS — R7303 Prediabetes: Secondary | ICD-10-CM | POA: Diagnosis not present

## 2023-01-10 DIAGNOSIS — E782 Mixed hyperlipidemia: Secondary | ICD-10-CM

## 2023-01-10 DIAGNOSIS — R0789 Other chest pain: Secondary | ICD-10-CM

## 2023-01-10 MED ORDER — METFORMIN HCL 500 MG PO TABS: 500.0000 mg | ORAL_TABLET | Freq: Two times a day (BID) | ORAL | 0 refills | Status: DC

## 2023-01-10 MED ORDER — PRAVASTATIN SODIUM 40 MG PO TABS: 40.0000 mg | ORAL_TABLET | Freq: Every day | ORAL | 0 refills | Status: DC

## 2023-01-11 LAB — CBC WITH DIFFERENTIAL/PLATELET
Basophils Absolute: 0.1 10*3/uL (ref 0.0–0.2)
Basos: 1 %
EOS (ABSOLUTE): 0.2 10*3/uL (ref 0.0–0.4)
Eos: 2 %
Hematocrit: 36 % (ref 34.0–46.6)
Hemoglobin: 11.8 g/dL (ref 11.1–15.9)
Immature Grans (Abs): 0 10*3/uL (ref 0.0–0.1)
Immature Granulocytes: 0 %
Lymphocytes Absolute: 2 10*3/uL (ref 0.7–3.1)
Lymphs: 26 %
MCH: 30.3 pg (ref 26.6–33.0)
MCHC: 32.8 g/dL (ref 31.5–35.7)
MCV: 92 fL (ref 79–97)
Monocytes Absolute: 0.5 10*3/uL (ref 0.1–0.9)
Monocytes: 7 %
Neutrophils Absolute: 4.8 10*3/uL (ref 1.4–7.0)
Neutrophils: 64 %
Platelets: 384 10*3/uL (ref 150–450)
RBC: 3.9 x10E6/uL (ref 3.77–5.28)
RDW: 13 % (ref 11.7–15.4)
WBC: 7.5 10*3/uL (ref 3.4–10.8)

## 2023-01-11 LAB — LIPID PANEL
Chol/HDL Ratio: 2.8 ratio (ref 0.0–4.4)
Cholesterol, Total: 147 mg/dL (ref 100–199)
HDL: 52 mg/dL (ref >39)
LDL Chol Calc (NIH): 71 mg/dL (ref 0–99)
Triglycerides: 139 mg/dL (ref 0–149)
VLDL Cholesterol Cal: 24 mg/dL (ref 5–40)

## 2023-01-11 LAB — COMPREHENSIVE METABOLIC PANEL
ALT: 12 IU/L (ref 0–32)
AST: 15 IU/L (ref 0–40)
Albumin/Globulin Ratio: 1.7 (ref 1.2–2.2)
Albumin: 3.9 g/dL (ref 3.8–4.8)
Alkaline Phosphatase: 90 IU/L (ref 44–121)
BUN/Creatinine Ratio: 23 (ref 12–28)
BUN: 15 mg/dL (ref 8–27)
Bilirubin Total: 0.2 mg/dL (ref 0.0–1.2)
CO2: 22 mmol/L (ref 20–29)
Calcium: 9.8 mg/dL (ref 8.7–10.3)
Chloride: 102 mmol/L (ref 96–106)
Creatinine, Ser: 0.65 mg/dL (ref 0.57–1.00)
Globulin, Total: 2.3 g/dL (ref 1.5–4.5)
Glucose: 91 mg/dL (ref 70–99)
Potassium: 4.6 mmol/L (ref 3.5–5.2)
Sodium: 139 mmol/L (ref 134–144)
Total Protein: 6.2 g/dL (ref 6.0–8.5)
eGFR: 93 mL/min/{1.73_m2} (ref >59)

## 2023-01-11 LAB — HEMOGLOBIN A1C
Est. average glucose Bld gHb Est-mCnc: 120 mg/dL
Hgb A1c MFr Bld: 5.8 % — ABNORMAL HIGH (ref 4.8–5.6)

## 2023-01-11 LAB — CARDIOVASCULAR RISK ASSESSMENT

## 2023-01-15 ENCOUNTER — Encounter: Payer: Self-pay | Admitting: Physician Assistant

## 2023-02-26 ENCOUNTER — Other Ambulatory Visit: Payer: Self-pay

## 2023-02-26 MED ORDER — ZOLPIDEM TARTRATE 10 MG PO TABS: ORAL_TABLET | ORAL | 2 refills | Status: DC

## 2023-03-06 DIAGNOSIS — N3091 Cystitis, unspecified with hematuria: Secondary | ICD-10-CM | POA: Diagnosis not present

## 2023-03-06 DIAGNOSIS — R300 Dysuria: Secondary | ICD-10-CM | POA: Diagnosis not present

## 2023-05-01 ENCOUNTER — Ambulatory Visit (INDEPENDENT_AMBULATORY_CARE_PROVIDER_SITE_OTHER): Payer: PPO | Admitting: Physician Assistant

## 2023-05-01 ENCOUNTER — Encounter: Payer: Self-pay | Admitting: Physician Assistant

## 2023-05-01 VITALS — BP 118/66 | HR 67 | Temp 97.3°F | Ht 66.0 in | Wt 138.8 lb

## 2023-05-01 DIAGNOSIS — E782 Mixed hyperlipidemia: Secondary | ICD-10-CM | POA: Diagnosis not present

## 2023-05-01 DIAGNOSIS — R7303 Prediabetes: Secondary | ICD-10-CM | POA: Diagnosis not present

## 2023-05-01 DIAGNOSIS — F5102 Adjustment insomnia: Secondary | ICD-10-CM | POA: Diagnosis not present

## 2023-05-01 DIAGNOSIS — M1990 Unspecified osteoarthritis, unspecified site: Secondary | ICD-10-CM | POA: Diagnosis not present

## 2023-05-01 NOTE — Progress Notes (Signed)
Established Patient Office Visit  Subjective:  Patient ID: Catherine Stokes, female    DOB: [birth year]  Age: 72 y.o. MRN: [id number]  CC:  Chief Complaint  Patient presents with   Medical Management of Chronic Issues    HPI Catherine Stokes presents for chronic follow up  Pt states she has had a history of hyperlipidemia for about 2 years.  She is currently on pravastatin 40mg  qd.  Due for labwork  Pt with history of arthritis - uses meloxicam as needed but takes most every day that she works at the [employer] that she has a history of prediabetes and never been told she was diabetic however she has been on metformin for several years.  States does not check glucose regularly  Pt with history of insomina- takes [place] and has been on this medication several years  Past Medical History:  Diagnosis Date   Prediabetes     Past Surgical History:  Procedure Laterality Date   CHOLECYSTECTOMY  07/24/2012   Procedure: LAPAROSCOPIC CHOLECYSTECTOMY WITH INTRAOPERATIVE CHOLANGIOGRAM;  Surgeon: Valarie Merino, MD;  Location: [facility];  Service: General;  Laterality: N/A;   HERNIA REPAIR  2005   umbilical   TUBAL LIGATION  1980    Family History  Problem Relation Age of Onset   Diabetes Mother    Heart disease Mother     Social History   Socioeconomic History   Marital status: Married    Spouse name: Not on file   Number of children: Not on file   Years of education: Not on file   Highest education level: Not on file  Occupational History   Occupation: Farm  Tobacco Use   Smoking status: Never   Smokeless tobacco: Never  Substance and Sexual Activity   Alcohol use: No   Drug use: No   Sexual activity: Not Currently  Other Topics Concern   Not on file  Social History Narrative   Not on file   Social Determinants of Health   Financial Resource Strain: Low Risk  (05/01/2023)   Overall Financial Resource Strain (CARDIA)    Difficulty of Paying Living  Expenses: Not hard at all  Food Insecurity: No Food Insecurity (05/01/2023)   Hunger Vital Sign    Worried About Running Out of Food in the Last Year: Never true    Ran Out of Food in the Last Year: Never true  Transportation Needs: No Transportation Needs (05/01/2023)   PRAPARE - Administrator, Civil Service (Medical): No    Lack of Transportation (Non-Medical): No  Physical Activity: Inactive (05/01/2023)   Exercise Vital Sign    Days of Exercise per Week: 0 days    Minutes of Exercise per Session: 0 min  Stress: No Stress Concern Present (05/01/2023)   [employer] of Occupational Health - Occupational Stress Questionnaire    Feeling of Stress : Not at all  Social Connections: Moderately Integrated (05/01/2023)   Social Connection and Isolation Panel [NHANES]    Frequency of Communication with Friends and Family: More than three times a week    Frequency of Social Gatherings with Friends and Family: More than three times a week    Attends Religious Services: More than 4 times per year    Active Member of [employer] or Organizations: No    Attends Banker Meetings: Never    Marital Status: Married  Catering manager Violence: Not At Risk (05/01/2023)   Humiliation, Afraid,  Rape, and Kick questionnaire    Fear of Current or Ex-Partner: No    Emotionally Abused: No    Physically Abused: No    Sexually Abused: No     Current Outpatient Medications:    meloxicam (MOBIC) 15 MG tablet, TAKE 1 TABLET(15 MG) BY MOUTH DAILY, Disp: 30 tablet, Rfl: 3   metFORMIN (GLUCOPHAGE) 500 MG tablet, Take 1 tablet (500 mg total) by mouth 2 (two) times daily., Disp: 180 tablet, Rfl: 0   pravastatin (PRAVACHOL) 40 MG tablet, Take 1 tablet (40 mg total) by mouth daily., Disp: 90 tablet, Rfl: 0   zolpidem (AMBIEN) 10 MG tablet, TAKE 1 TABLET(10 MG) BY MOUTH AT BEDTIME AS NEEDED, Disp: 30 tablet, Rfl: 2   No Known Allergies  CONSTITUTIONAL: Negative for chills, fatigue, fever,  unintentional weight gain and unintentional weight loss.  E/N/T: Negative for ear pain, nasal congestion and sore throat.  CARDIOVASCULAR: Negative for chest pain, dizziness, palpitations and pedal edema.  RESPIRATORY: Negative for recent cough and dyspnea.  GASTROINTESTINAL: Negative for abdominal pain, acid reflux symptoms, constipation, diarrhea, nausea and vomiting.  MSK: Negative for arthralgias and myalgias.  INTEGUMENTARY: Negative for rash.  NEUROLOGICAL: Negative for dizziness and headaches.  PSYCHIATRIC: Negative for sleep disturbance and to question depression screen.  Negative for depression, negative for anhedonia.        Objective:  PHYSICAL EXAM:   VS: BP 118/66 (BP Location: Left Arm, Patient Position: Sitting, Cuff Size: Normal)   Pulse 67   Temp (!) 97.3 F (36.3 C) (Temporal)   Ht 5\' 6"  (1.676 m)   Wt 138 lb 12.8 oz (63 kg)   SpO2 97%   BMI 22.40 kg/m   GEN: Well nourished, well developed, in no acute distress  Cardiac: RRR; no murmurs, rubs, or gallops,no edema -  Respiratory:  normal respiratory rate and pattern with no distress - normal breath sounds with no rales, rhonchi, wheezes or rubs MS: no deformity or atrophy  Skin: warm and dry, no rash  Neuro:  Alert and Oriented x 3, - CN II-Xii grossly intact Psych: euthymic mood, appropriate affect and demeanor  Health Maintenance Due  Topic Date Due   DTaP/Tdap/Td (1 - Tdap) Never done    There are no preventive care reminders to display for this patient.  Lab Results  Component Value Date   TSH 1.450 05/13/2021   Lab Results  Component Value Date   WBC 7.5 01/10/2023   HGB 11.8 01/10/2023   HCT 36.0 01/10/2023   MCV 92 01/10/2023   PLT 384 01/10/2023   Lab Results  Component Value Date   NA 139 01/10/2023   K 4.6 01/10/2023   CO2 22 01/10/2023   GLUCOSE 91 01/10/2023   BUN 15 01/10/2023   CREATININE 0.65 01/10/2023   BILITOT 0.2 01/10/2023   ALKPHOS 90 01/10/2023   AST 15 01/10/2023    ALT 12 01/10/2023   PROT 6.2 01/10/2023   ALBUMIN 3.9 01/10/2023   CALCIUM 9.8 01/10/2023   EGFR 93 01/10/2023   Lab Results  Component Value Date   CHOL 147 01/10/2023   Lab Results  Component Value Date   HDL 52 01/10/2023   Lab Results  Component Value Date   LDLCALC 71 01/10/2023   Lab Results  Component Value Date   TRIG 139 01/10/2023   Lab Results  Component Value Date   CHOLHDL 2.8 01/10/2023   Lab Results  Component Value Date   HGBA1C 5.8 (H) 01/10/2023  Assessment & Plan:   Problem List Items Addressed This Visit               Musculoskeletal and Integument   Arthritis Continue mobic as needed     Other   Mixed hyperlipidemia (Chronic)  Continue pravastatin   Adjustment insomnia (Chronic) Continue ambien as needed   Prediabetes Continue glucophage Recommend low carb/low sugar diet       No orders of the defined types were placed in this encounter.   Follow-up: Return in about 6 months (around 10/31/2023) for chronic fasting follow-up.    Catherine R Natilee Gauer, PA-C

## 2023-05-02 LAB — COMPREHENSIVE METABOLIC PANEL
ALT: 16 IU/L (ref 0–32)
AST: 14 IU/L (ref 0–40)
Albumin/Globulin Ratio: 1.9 (ref 1.2–2.2)
Albumin: 4.3 g/dL (ref 3.8–4.8)
Alkaline Phosphatase: 90 IU/L (ref 44–121)
BUN/Creatinine Ratio: 30 — ABNORMAL HIGH (ref 12–28)
BUN: 21 mg/dL (ref 8–27)
Bilirubin Total: 0.3 mg/dL (ref 0.0–1.2)
CO2: 24 mmol/L (ref 20–29)
Calcium: 9.8 mg/dL (ref 8.7–10.3)
Chloride: 103 mmol/L (ref 96–106)
Creatinine, Ser: 0.71 mg/dL (ref 0.57–1.00)
Globulin, Total: 2.3 g/dL (ref 1.5–4.5)
Glucose: 92 mg/dL (ref 70–99)
Potassium: 5.2 mmol/L (ref 3.5–5.2)
Sodium: 139 mmol/L (ref 134–144)
Total Protein: 6.6 g/dL (ref 6.0–8.5)
eGFR: 90 mL/min/{1.73_m2} (ref >59)

## 2023-05-02 LAB — CBC WITH DIFFERENTIAL/PLATELET
Basophils Absolute: 0.1 10*3/uL (ref 0.0–0.2)
Basos: 2 %
EOS (ABSOLUTE): 0.3 10*3/uL (ref 0.0–0.4)
Eos: 5 %
Hematocrit: 37.7 % (ref 34.0–46.6)
Hemoglobin: 11.9 g/dL (ref 11.1–15.9)
Immature Grans (Abs): 0 10*3/uL (ref 0.0–0.1)
Immature Granulocytes: 0 %
Lymphocytes Absolute: 1.9 10*3/uL (ref 0.7–3.1)
Lymphs: 29 %
MCH: 29.1 pg (ref 26.6–33.0)
MCHC: 31.6 g/dL (ref 31.5–35.7)
MCV: 92 fL (ref 79–97)
Monocytes Absolute: 0.4 10*3/uL (ref 0.1–0.9)
Monocytes: 6 %
Neutrophils Absolute: 3.7 10*3/uL (ref 1.4–7.0)
Neutrophils: 58 %
Platelets: 350 10*3/uL (ref 150–450)
RBC: 4.09 x10E6/uL (ref 3.77–5.28)
RDW: 12.7 % (ref 11.7–15.4)
WBC: 6.4 10*3/uL (ref 3.4–10.8)

## 2023-05-02 LAB — HEMOGLOBIN A1C
Est. average glucose Bld gHb Est-mCnc: 120 mg/dL
Hgb A1c MFr Bld: 5.8 % — ABNORMAL HIGH (ref 4.8–5.6)

## 2023-05-02 LAB — LIPID PANEL
Chol/HDL Ratio: 2.4 ratio (ref 0.0–4.4)
Cholesterol, Total: 159 mg/dL (ref 100–199)
HDL: 65 mg/dL (ref >39)
LDL Chol Calc (NIH): 76 mg/dL (ref 0–99)
Triglycerides: 102 mg/dL (ref 0–149)
VLDL Cholesterol Cal: 18 mg/dL (ref 5–40)

## 2023-05-02 LAB — TSH: TSH: 1.72 u[IU]/mL (ref 0.450–4.500)

## 2023-05-14 ENCOUNTER — Other Ambulatory Visit: Payer: Self-pay

## 2023-05-14 ENCOUNTER — Other Ambulatory Visit: Payer: Self-pay | Admitting: Physician Assistant

## 2023-05-14 DIAGNOSIS — M1990 Unspecified osteoarthritis, unspecified site: Secondary | ICD-10-CM

## 2023-05-14 DIAGNOSIS — E782 Mixed hyperlipidemia: Secondary | ICD-10-CM

## 2023-05-14 MED ORDER — MELOXICAM 15 MG PO TABS: ORAL_TABLET | ORAL | 3 refills | Status: DC

## 2023-06-04 ENCOUNTER — Ambulatory Visit (INDEPENDENT_AMBULATORY_CARE_PROVIDER_SITE_OTHER): Payer: PPO

## 2023-06-04 DIAGNOSIS — M8580 Other specified disorders of bone density and structure, unspecified site: Secondary | ICD-10-CM | POA: Diagnosis not present

## 2023-06-04 DIAGNOSIS — Z0000 Encounter for general adult medical examination without abnormal findings: Secondary | ICD-10-CM

## 2023-06-04 NOTE — Progress Notes (Signed)
Subjective:   Catherine Stokes is a 72 y.o. female who presents for Medicare Annual (Subsequent) preventive examination.  Visit Complete: Virtual  I connected with  [name] on 06/04/23 by a audio enabled telemedicine application and verified that I am speaking with the correct person using two identifiers.  Patient Location: Home  Provider Location: Office/Clinic  I discussed the limitations of evaluation and management by telemedicine. The patient expressed understanding and agreed to proceed.  Patient Medicare AWV questionnaire was completed by the patient on 06/04/2023; I have confirmed that all information answered by patient is correct and no changes since this date.  Review of Systems     Cardiac Risk Factors include: advanced age (>68men, >71 women);diabetes mellitus     Objective:    Today's Vitals   06/04/23 1318  PainSc: 0-No pain   There is no height or weight on file to calculate BMI.     06/04/2023    1:10 PM 07/24/2012   11:15 AM 07/17/2012    2:18 PM  Advanced Directives  Does Patient Have a Medical Advance Directive? Yes Patient does not have advance directive;Patient would not like information Patient does not have advance directive;Patient would not like information  Does patient want to make changes to medical advance directive? No - Patient declined    Pre-existing out of facility DNR order (yellow form or pink MOST form)  No No    Current Medications (verified) Outpatient Encounter Medications as of 06/04/2023  Medication Sig   meloxicam (MOBIC) 15 MG tablet TAKE 1 TABLET(15 MG) BY MOUTH DAILY   metFORMIN (GLUCOPHAGE) 500 MG tablet Take 1 tablet (500 mg total) by mouth 2 (two) times daily.   pravastatin (PRAVACHOL) 40 MG tablet TAKE 1 TABLET(40 MG) BY MOUTH DAILY   zolpidem (AMBIEN) 10 MG tablet TAKE 1 TABLET(10 MG) BY MOUTH AT BEDTIME AS NEEDED   No facility-administered encounter medications on file as of 06/04/2023.    Allergies  (verified) Patient has no known allergies.   History: Past Medical History:  Diagnosis Date   Prediabetes    Past Surgical History:  Procedure Laterality Date   CHOLECYSTECTOMY  07/24/2012   Procedure: LAPAROSCOPIC CHOLECYSTECTOMY WITH INTRAOPERATIVE CHOLANGIOGRAM;  Surgeon: Valarie Merino, MD;  Location: [facility];  Service: General;  Laterality: N/A;   HERNIA REPAIR  2005   umbilical   TUBAL LIGATION  1980   Family History  Problem Relation Age of Onset   Diabetes Mother    Heart disease Mother    Social History   Socioeconomic History   Marital status: Married    Spouse name: Not on file   Number of children: Not on file   Years of education: Not on file   Highest education level: Not on file  Occupational History   Occupation: Farm  Tobacco Use   Smoking status: Never   Smokeless tobacco: Never  Substance and Sexual Activity   Alcohol use: No   Drug use: No   Sexual activity: Not Currently  Other Topics Concern   Not on file  Social History Narrative   Not on file   Social Determinants of Health   Financial Resource Strain: Low Risk  (06/04/2023)   Overall Financial Resource Strain (CARDIA)    Difficulty of Paying Living Expenses: Not hard at all  Food Insecurity: No Food Insecurity (06/04/2023)   Hunger Vital Sign    Worried About Running Out of Food in the Last Year: Never true  Ran Out of Food in the Last Year: Never true  Transportation Needs: No Transportation Needs (06/04/2023)   PRAPARE - Administrator, Civil Service (Medical): No    Lack of Transportation (Non-Medical): No  Physical Activity: Inactive (06/04/2023)   Exercise Vital Sign    Days of Exercise per Week: 0 days    Minutes of Exercise per Session: 0 min  Stress: No Stress Concern Present (06/04/2023)   [employer] of Occupational Health - Occupational Stress Questionnaire    Feeling of Stress : Not at all  Social Connections: Moderately Integrated (06/04/2023)   Social  Connection and Isolation Panel [NHANES]    Frequency of Communication with Friends and Family: More than three times a week    Frequency of Social Gatherings with Friends and Family: More than three times a week    Attends Religious Services: More than 4 times per year    Active Member of [employer] or Organizations: No    Attends Engineer, structural: Never    Marital Status: Married    Tobacco Counseling Counseling given: Not Answered   Clinical Intake:  Pre-visit preparation completed: No  Pain : No/denies pain Pain Score: 0-No pain     Diabetes: No  How often do you need to have someone help you when you read instructions, pamphlets, or other written materials from your doctor or pharmacy?: 1 - Never  Interpreter Needed?: No      Activities of Daily Living    06/04/2023    1:09 PM  In your present state of health, do you have any difficulty performing the following activities:  Hearing? 0  Vision? 0  Difficulty concentrating or making decisions? 0  Walking or climbing stairs? 0  Dressing or bathing? 0  Doing errands, shopping? 0  Preparing Food and eating ? N  Using the Toilet? N  In the past six months, have you accidently leaked urine? N  Do you have problems with loss of bowel control? N  Managing your Medications? N  Managing your Finances? N  Housekeeping or managing your Housekeeping? N    Patient Care Team: Marianne Sofia, Cordelia Poche as PCP - General (Physician Assistant)  Indicate any recent Medical Services you may have received from other than [facility] providers in the past year (date may be approximate).     Assessment:   This is a routine wellness examination for Catherine Stokes.  Hearing/Vision screen No results found.  Dietary issues and exercise activities discussed:     Goals Addressed   None   Depression Screen    06/04/2023    1:09 PM 05/01/2023    8:17 AM 02/03/2022    8:17 AM 01/13/2021    8:53 AM 12/30/2019    8:42 AM  PHQ 2/9 Scores  PHQ -  2 Score 0 0 0 0 0    Fall Risk    06/04/2023    1:09 PM 05/01/2023    8:17 AM 12/29/2022    8:52 AM 02/03/2022    8:17 AM 10/05/2021    8:21 AM  Fall Risk   Falls in the past year? 0 0 0 0 0  Number falls in past yr: 0 0 0 0 0  Injury with Fall? 0 0 0 0 0  Risk for fall due to : No Fall Risks No Fall Risks No Fall Risks No Fall Risks No Fall Risks  Follow up Falls evaluation completed Falls evaluation completed  Falls prevention discussed Falls prevention discussed  MEDICARE RISK AT HOME:  Medicare Risk at Home - 06/04/23 1310     Any stairs in or around the home? No    If so, are there any without handrails? No    Home free of loose throw rugs in walkways, pet beds, electrical cords, etc? Yes    Adequate lighting in your home to reduce risk of falls? Yes    Life alert? No    Use of a cane, [place] or w/c? No    Grab bars in the bathroom? No    Shower chair or bench in shower? No    Elevated toilet seat or a handicapped toilet? No             TIMED UP AND GO:  Was the test performed?  No    Cognitive Function:        06/04/2023    1:16 PM  6CIT Screen  What Year? 0 points  What month? 0 points  What time? 0 points  Count back from 20 0 points  Months in reverse 0 points  Repeat phrase 0 points  Total Score 0 points    Immunizations Immunization History  Administered Date(s) Administered   Fluad Quad(high Dose 65+) 08/31/2020, 09/21/2021   Influenza,inj,Quad PF,6+ Mos 08/28/2019   Influenza-Unspecified 08/27/2014, 09/20/2016, 08/27/2018, 08/27/2020   Moderna Sars-Covid-2 Vaccination 12/23/2019, 01/21/2020   Pneumococcal Conjugate-13 01/06/2016   Pneumococcal Polysaccharide-23 08/30/2018   Unspecified SARS-COV-2 Vaccination 12/23/2019, 01/21/2020, 09/23/2020    TDAP status: Up to date  Flu Vaccine status: Up to date  Pneumococcal vaccine status: Up to date  Covid-19 vaccine status: Completed vaccines  Qualifies for Shingles Vaccine? Yes   Zostavax  completed No   Shingrix Completed?: No.    Education has been provided regarding the importance of this vaccine. Patient has been advised to call insurance company to determine out of pocket expense if they have not yet received this vaccine. Advised may also receive vaccine at local pharmacy or Health Dept. Verbalized acceptance and understanding.  Screening Tests Health Maintenance  Topic Date Due   DTaP/Tdap/Td (1 - Tdap) Never done   Colonoscopy  12/30/2023 (Originally 01/25/2022)   Zoster Vaccines- Shingrix (1 of 2) 04/07/2024 (Originally 11/17/2000)   DEXA SCAN  04/30/2024 (Originally 04/06/2023)   INFLUENZA VACCINE  06/28/2023   MAMMOGRAM  12/16/2023   Medicare Annual Wellness (AWV)  06/03/2024   Pneumonia Vaccine 18+ Years old  Completed   HPV VACCINES  Aged Out   COVID-19 Vaccine  Discontinued   Hepatitis C Screening  Discontinued    Health Maintenance  Health Maintenance Due  Topic Date Due   DTaP/Tdap/Td (1 - Tdap) Never done    Colorectal cancer screening: Type of screening: Colonoscopy. Completed 07/28/2012. Repeat every 10 years  Mammogram status: Completed 12/15/2022. Repeat every year  Bone Density status: Ordered 06/04/2023. Pt provided with contact info and advised to call to schedule appt.  Lung Cancer Screening: (Low Dose CT Chest recommended if Age 75-80 years, 20 pack-year currently smoking OR have quit w/in 15years.) does not qualify.   Lung Cancer Screening Referral: N/A  Additional Screening:  Hepatitis C Screening: does not qualify; Completed N/A  Vision Screening: Recommended annual ophthalmology exams for early detection of glaucoma and other disorders of the eye. Is the patient up to date with their annual eye exam?  Yes  Who is the provider or what is the name of the office in which the patient attends annual eye exams? [facility] If pt  is not established with a provider, would they like to be referred to a provider to establish care? No .    Dental Screening: Recommended annual dental exams for proper oral hygiene    Community Resource Referral / Chronic Care Management: CRR required this visit?  No   CCM required this visit?  No     Plan:     I have personally reviewed and noted the following in the patient's chart:   Medical and social history Use of alcohol, tobacco or illicit drugs  Current medications and supplements including opioid prescriptions. Patient is not currently taking opioid prescriptions. Functional ability and status Nutritional status Physical activity Advanced directives List of other physicians Hospitalizations, surgeries, and ER visits in previous 12 months Vitals Screenings to include cognitive, depression, and falls Referrals and appointments  In addition, I have reviewed and discussed with patient certain preventive protocols, quality metrics, and best practice recommendations. A written personalized care plan for preventive services as well as general preventive health recommendations were provided to patient.     Jomarie Longs, CMA   06/04/2023   After Visit Summary: (MyChart) Due to this being a telephonic visit, the after visit summary with patients personalized plan was offered to patient via MyChart   Nurse Notes: Patient denies referral to GI for colonoscopy at this time. Referral sent for DEXA Scan

## 2023-07-08 DIAGNOSIS — J0100 Acute maxillary sinusitis, unspecified: Secondary | ICD-10-CM | POA: Diagnosis not present

## 2023-07-11 ENCOUNTER — Other Ambulatory Visit: Payer: Self-pay | Admitting: Physician Assistant

## 2023-07-18 ENCOUNTER — Other Ambulatory Visit: Payer: Self-pay | Admitting: Physician Assistant

## 2023-07-18 DIAGNOSIS — E782 Mixed hyperlipidemia: Secondary | ICD-10-CM

## 2023-07-28 DIAGNOSIS — R300 Dysuria: Secondary | ICD-10-CM | POA: Diagnosis not present

## 2023-07-28 DIAGNOSIS — N3091 Cystitis, unspecified with hematuria: Secondary | ICD-10-CM | POA: Diagnosis not present

## 2023-08-14 ENCOUNTER — Other Ambulatory Visit: Payer: Self-pay | Admitting: Physician Assistant

## 2023-09-12 DIAGNOSIS — H5203 Hypermetropia, bilateral: Secondary | ICD-10-CM | POA: Diagnosis not present

## 2023-09-12 DIAGNOSIS — R7303 Prediabetes: Secondary | ICD-10-CM | POA: Diagnosis not present

## 2023-09-12 DIAGNOSIS — H25813 Combined forms of age-related cataract, bilateral: Secondary | ICD-10-CM | POA: Diagnosis not present

## 2023-09-12 LAB — HM DIABETES EYE EXAM

## 2023-09-27 DIAGNOSIS — N760 Acute vaginitis: Secondary | ICD-10-CM | POA: Diagnosis not present

## 2023-10-04 ENCOUNTER — Other Ambulatory Visit: Payer: Self-pay | Admitting: Physician Assistant

## 2023-10-10 ENCOUNTER — Other Ambulatory Visit: Payer: Self-pay | Admitting: Family Medicine

## 2023-10-16 ENCOUNTER — Other Ambulatory Visit: Payer: Self-pay | Admitting: Physician Assistant

## 2023-10-16 DIAGNOSIS — E782 Mixed hyperlipidemia: Secondary | ICD-10-CM

## 2023-10-30 DIAGNOSIS — Z01419 Encounter for gynecological examination (general) (routine) without abnormal findings: Secondary | ICD-10-CM | POA: Diagnosis not present

## 2023-10-31 ENCOUNTER — Ambulatory Visit: Payer: PPO | Admitting: Physician Assistant

## 2023-10-31 ENCOUNTER — Encounter: Payer: Self-pay | Admitting: Physician Assistant

## 2023-10-31 VITALS — BP 122/70 | HR 68 | Temp 97.7°F | Resp 16 | Ht 66.0 in | Wt 140.8 lb

## 2023-10-31 DIAGNOSIS — E782 Mixed hyperlipidemia: Secondary | ICD-10-CM | POA: Diagnosis not present

## 2023-10-31 DIAGNOSIS — R7303 Prediabetes: Secondary | ICD-10-CM | POA: Diagnosis not present

## 2023-10-31 DIAGNOSIS — F5102 Adjustment insomnia: Secondary | ICD-10-CM

## 2023-10-31 DIAGNOSIS — N3000 Acute cystitis without hematuria: Secondary | ICD-10-CM

## 2023-10-31 DIAGNOSIS — Z1211 Encounter for screening for malignant neoplasm of colon: Secondary | ICD-10-CM

## 2023-10-31 LAB — POCT URINALYSIS DIP (CLINITEK)
Bilirubin, UA: NEGATIVE
Blood, UA: NEGATIVE
Glucose, UA: NEGATIVE mg/dL
Ketones, POC UA: NEGATIVE mg/dL
Nitrite, UA: NEGATIVE
POC PROTEIN,UA: NEGATIVE
Spec Grav, UA: >=1.03 — AB (ref 1.010–1.025)
Urobilinogen, UA: 0.2 U/dL
pH, UA: 6 (ref 5.0–8.0)

## 2023-10-31 LAB — LIPID PANEL
Chol/HDL Ratio: 2.7 {ratio} (ref 0.0–4.4)
Cholesterol, Total: 170 mg/dL (ref 100–199)
HDL: 63 mg/dL (ref >39)
LDL Chol Calc (NIH): 87 mg/dL (ref 0–99)
Triglycerides: 114 mg/dL (ref 0–149)
VLDL Cholesterol Cal: 20 mg/dL (ref 5–40)

## 2023-10-31 LAB — COMPREHENSIVE METABOLIC PANEL
ALT: 12 [IU]/L (ref 0–32)
AST: 14 [IU]/L (ref 0–40)
Albumin: 4.2 g/dL (ref 3.8–4.8)
Alkaline Phosphatase: 83 [IU]/L (ref 44–121)
BUN/Creatinine Ratio: 32 — ABNORMAL HIGH (ref 12–28)
BUN: 23 mg/dL (ref 8–27)
Bilirubin Total: 0.2 mg/dL (ref 0.0–1.2)
CO2: 24 mmol/L (ref 20–29)
Calcium: 9.5 mg/dL (ref 8.7–10.3)
Chloride: 105 mmol/L (ref 96–106)
Creatinine, Ser: 0.73 mg/dL (ref 0.57–1.00)
Globulin, Total: 2.4 g/dL (ref 1.5–4.5)
Glucose: 84 mg/dL (ref 70–99)
Potassium: 5.1 mmol/L (ref 3.5–5.2)
Sodium: 142 mmol/L (ref 134–144)
Total Protein: 6.6 g/dL (ref 6.0–8.5)
eGFR: 87 mL/min/{1.73_m2} (ref >59)

## 2023-10-31 LAB — CBC WITH DIFFERENTIAL/PLATELET
Basophils Absolute: 0.1 10*3/uL (ref 0.0–0.2)
Basos: 1 %
EOS (ABSOLUTE): 0.2 10*3/uL (ref 0.0–0.4)
Eos: 3 %
Hematocrit: 37.7 % (ref 34.0–46.6)
Hemoglobin: 12.1 g/dL (ref 11.1–15.9)
Immature Grans (Abs): 0 10*3/uL (ref 0.0–0.1)
Immature Granulocytes: 0 %
Lymphocytes Absolute: 2.3 10*3/uL (ref 0.7–3.1)
Lymphs: 29 %
MCH: 30.1 pg (ref 26.6–33.0)
MCHC: 32.1 g/dL (ref 31.5–35.7)
MCV: 94 fL (ref 79–97)
Monocytes Absolute: 0.6 10*3/uL (ref 0.1–0.9)
Monocytes: 8 %
Neutrophils Absolute: 4.5 10*3/uL (ref 1.4–7.0)
Neutrophils: 59 %
Platelets: 323 10*3/uL (ref 150–450)
RBC: 4.02 x10E6/uL (ref 3.77–5.28)
RDW: 12.8 % (ref 11.7–15.4)
WBC: 7.7 10*3/uL (ref 3.4–10.8)

## 2023-10-31 LAB — HEMOGLOBIN A1C
Est. average glucose Bld gHb Est-mCnc: 126 mg/dL
Hgb A1c MFr Bld: 6 % — ABNORMAL HIGH (ref 4.8–5.6)

## 2023-10-31 MED ORDER — BLOOD GLUCOSE MONITORING SUPPL DEVI: 1.0000 | Freq: Three times a day (TID) | 0 refills | Status: AC

## 2023-10-31 MED ORDER — LANCETS MISC. MISC: 1.0000 | Freq: Three times a day (TID) | 0 refills | Status: AC

## 2023-10-31 MED ORDER — NITROFURANTOIN MONOHYD MACRO 100 MG PO CAPS: 100.0000 mg | ORAL_CAPSULE | Freq: Two times a day (BID) | ORAL | 0 refills | Status: DC

## 2023-10-31 MED ORDER — LANCET DEVICE MISC: 1.0000 | Freq: Three times a day (TID) | 0 refills | Status: AC

## 2023-10-31 MED ORDER — BLOOD GLUCOSE TEST VI STRP: 1.0000 | ORAL_STRIP | Freq: Three times a day (TID) | 0 refills | Status: DC

## 2023-10-31 NOTE — Progress Notes (Signed)
Established Patient Office Visit  Subjective:  Patient ID: Catherine Stokes, female    DOB: May 05, 1951  Age: 72 y.o. MRN: [id number]  CC:  Chief Complaint  Patient presents with   Medical Management of Chronic Issues    HPI Catherine Stokes presents for chronic follow up  Pt states she has had a history of hyperlipidemia for about 3 years.  She is currently on pravachol 40mg  qd.  Due for labwork  Pt with history of arthritis - uses meloxicam 15mg  as needed but takes most every day that she works at the [employer] that she has a history of prediabetes and never been told she was diabetic however she has been on metformin 500mg  bid for several years.  States does not check glucose regularly but would like rx for diabetic supplies  Pt with history of insomina- takes [place] and has been on this medication several years  Pt complains of dysuria that started about 2 weeks ago  Pt would like referral to Dr Chales Abrahams for colonoscopy  Past Medical History:  Diagnosis Date   Prediabetes     Past Surgical History:  Procedure Laterality Date   CHOLECYSTECTOMY  07/24/2012   Procedure: LAPAROSCOPIC CHOLECYSTECTOMY WITH INTRAOPERATIVE CHOLANGIOGRAM;  Surgeon: Valarie Merino, MD;  Location: [facility];  Service: General;  Laterality: N/A;   HERNIA REPAIR  2005   umbilical   TUBAL LIGATION  1980    Family History  Problem Relation Age of Onset   Diabetes Mother    Heart disease Mother     Social History   Socioeconomic History   Marital status: Married    Spouse name: Not on file   Number of children: Not on file   Years of education: Not on file   Highest education level: Not on file  Occupational History   Occupation: Farm  Tobacco Use   Smoking status: Never   Smokeless tobacco: Never  Substance and Sexual Activity   Alcohol use: No   Drug use: No   Sexual activity: Not Currently  Other Topics Concern   Not on file  Social History Narrative   Not on file    Social Determinants of Health   Financial Resource Strain: Low Risk  (06/04/2023)   Overall Financial Resource Strain (CARDIA)    Difficulty of Paying Living Expenses: Not hard at all  Food Insecurity: No Food Insecurity (06/04/2023)   Hunger Vital Sign    Worried About Running Out of Food in the Last Year: Never true    Ran Out of Food in the Last Year: Never true  Transportation Needs: No Transportation Needs (06/04/2023)   PRAPARE - Administrator, Civil Service (Medical): No    Lack of Transportation (Non-Medical): No  Physical Activity: Inactive (06/04/2023)   Exercise Vital Sign    Days of Exercise per Week: 0 days    Minutes of Exercise per Session: 0 min  Stress: No Stress Concern Present (06/04/2023)   [employer] of Occupational Health - Occupational Stress Questionnaire    Feeling of Stress : Not at all  Social Connections: Moderately Integrated (06/04/2023)   Social Connection and Isolation Panel [NHANES]    Frequency of Communication with Friends and Family: More than three times a week    Frequency of Social Gatherings with Friends and Family: More than three times a week    Attends Religious Services: More than 4 times per year    Active Member of  Clubs or Organizations: No    Attends Banker Meetings: Never    Marital Status: Married  Catering manager Violence: Not At Risk (06/04/2023)   Humiliation, Afraid, Rape, and Kick questionnaire    Fear of Current or Ex-Partner: No    Emotionally Abused: No    Physically Abused: No    Sexually Abused: No     Current Outpatient Medications:    meloxicam (MOBIC) 15 MG tablet, TAKE 1 TABLET(15 MG) BY MOUTH DAILY, Disp: 30 tablet, Rfl: 3   metFORMIN (GLUCOPHAGE) 500 MG tablet, TAKE 1 TABLET(500 MG) BY MOUTH TWICE DAILY, Disp: 180 tablet, Rfl: 0   nitrofurantoin, macrocrystal-monohydrate, (MACROBID) 100 MG capsule, Take 1 capsule (100 mg total) by mouth 2 (two) times daily., Disp: 14 capsule, Rfl: 0    pravastatin (PRAVACHOL) 40 MG tablet, TAKE 1 TABLET(40 MG) BY MOUTH DAILY, Disp: 90 tablet, Rfl: 0   zolpidem (AMBIEN) 10 MG tablet, TAKE 1 TABLET(10 MG) BY MOUTH AT BEDTIME AS NEEDED, Disp: 30 tablet, Rfl: 0   No Known Allergies  CONSTITUTIONAL: Negative for chills, fatigue, fever, unintentional weight gain and unintentional weight loss.  E/N/T: Negative for ear pain, nasal congestion and sore throat.  CARDIOVASCULAR: Negative for chest pain, dizziness, palpitations and pedal edema.  RESPIRATORY: Negative for recent cough and dyspnea.  GASTROINTESTINAL: Negative for abdominal pain, acid reflux symptoms, constipation, diarrhea, nausea and vomiting.  MSK: Negative for arthralgias and myalgias.  INTEGUMENTARY: Negative for rash.  NEUROLOGICAL: Negative for dizziness and headaches.  PSYCHIATRIC: Negative for sleep disturbance and to question depression screen.  Negative for depression, negative for anhedonia.         Objective:  PHYSICAL EXAM:   VS: BP 122/70 (BP Location: Right Arm, Patient Position: Sitting, Cuff Size: Normal)   Pulse 68   Temp 97.7 F (36.5 C) (Temporal)   Resp 16   Ht 5\' 6"  (1.676 m)   Wt 140 lb 12.8 oz (63.9 kg)   SpO2 95%   BMI 22.73 kg/m   GEN: Well nourished, well developed, in no acute distress   Cardiac: RRR; no murmurs, rubs, or gallops,no edema -  Respiratory:  normal respiratory rate and pattern with no distress - normal breath sounds with no rales, rhonchi, wheezes or rubs  MS: no deformity or atrophy  Skin: warm and dry, no rash  Neuro:  Alert and Oriented x 3,  - CN II-Xii grossly intact Psych: euthymic mood, appropriate affect and demeanor  Office Visit on 10/31/2023  Component Date Value Ref Range Status   Color, UA 10/31/2023 yellow  yellow Final   Clarity, UA 10/31/2023 clear  clear Final   Glucose, UA 10/31/2023 negative  negative mg/dL Final   Bilirubin, UA 13/06/6577 negative  negative Final   Ketones, POC UA 10/31/2023 negative   negative mg/dL Final   Spec Grav, UA 46/96/2952 >=1.030 (A)  1.010 - 1.025 Final   Blood, UA 10/31/2023 negative  negative Final   pH, UA 10/31/2023 6.0  5.0 - 8.0 Final   POC PROTEIN,UA 10/31/2023 negative  negative, trace Final   Urobilinogen, UA 10/31/2023 0.2  0.2 or 1.0 E.U./dL Final   Nitrite, UA 84/13/2440 Negative  Negative Final   Leukocytes, UA 10/31/2023 Trace (A)  Negative Final     Health Maintenance Due  Topic Date Due   DTaP/Tdap/Td (1 - Tdap) Never done    There are no preventive care reminders to display for this patient.  Lab Results  Component Value Date   TSH  1.720 05/01/2023   Lab Results  Component Value Date   WBC 6.4 05/01/2023   HGB 11.9 05/01/2023   HCT 37.7 05/01/2023   MCV 92 05/01/2023   PLT 350 05/01/2023   Lab Results  Component Value Date   NA 139 05/01/2023   K 5.2 05/01/2023   CO2 24 05/01/2023   GLUCOSE 92 05/01/2023   BUN 21 05/01/2023   CREATININE 0.71 05/01/2023   BILITOT 0.3 05/01/2023   ALKPHOS 90 05/01/2023   AST 14 05/01/2023   ALT 16 05/01/2023   PROT 6.6 05/01/2023   ALBUMIN 4.3 05/01/2023   CALCIUM 9.8 05/01/2023   EGFR 90 05/01/2023   Lab Results  Component Value Date   CHOL 159 05/01/2023   Lab Results  Component Value Date   HDL 65 05/01/2023   Lab Results  Component Value Date   LDLCALC 76 05/01/2023   Lab Results  Component Value Date   TRIG 102 05/01/2023   Lab Results  Component Value Date   CHOLHDL 2.4 05/01/2023   Lab Results  Component Value Date   HGBA1C 5.8 (H) 05/01/2023      Assessment & Plan:   Problem List Items Addressed This Visit               Musculoskeletal and Integument   Arthritis Continue mobic as needed     Other   Mixed hyperlipidemia (Chronic)  Continue pravastatin   Adjustment insomnia (Chronic) Continue ambien as needed   Prediabetes Continue glucophage Recommend low carb/low sugar diet Rx for diabetic supplies  Acute cystitis without hematuria Rx  for macrobid 100mg  bid x 7 days  Colon cancer screening Refer to Dr Chales Abrahams       Meds ordered this encounter  Medications   nitrofurantoin, macrocrystal-monohydrate, (MACROBID) 100 MG capsule    Sig: Take 1 capsule (100 mg total) by mouth 2 (two) times daily.    Dispense:  14 capsule    Refill:  0    Order Specific Question:   Supervising Provider    Answer:   Corey Harold    Follow-up: Return in about 6 months (around 04/30/2024) for chronic fasting follow-up.    SARA R Tarick Parenteau, PA-C

## 2023-11-13 ENCOUNTER — Other Ambulatory Visit: Payer: Self-pay | Admitting: Physician Assistant

## 2023-11-13 DIAGNOSIS — M1990 Unspecified osteoarthritis, unspecified site: Secondary | ICD-10-CM

## 2023-11-26 ENCOUNTER — Other Ambulatory Visit: Payer: Self-pay | Admitting: Physician Assistant

## 2023-12-04 ENCOUNTER — Encounter: Payer: Self-pay | Admitting: Family Medicine

## 2023-12-04 ENCOUNTER — Ambulatory Visit (INDEPENDENT_AMBULATORY_CARE_PROVIDER_SITE_OTHER): Payer: PPO | Admitting: Family Medicine

## 2023-12-04 VITALS — BP 120/64 | HR 73 | Temp 97.6°F | Resp 16 | Ht 66.0 in | Wt 136.8 lb

## 2023-12-04 DIAGNOSIS — L299 Pruritus, unspecified: Secondary | ICD-10-CM

## 2023-12-04 MED ORDER — HYDROXYZINE PAMOATE 25 MG PO CAPS: 25.0000 mg | ORAL_CAPSULE | Freq: Three times a day (TID) | ORAL | 0 refills | Status: DC | PRN

## 2023-12-04 MED ORDER — TRIAMCINOLONE ACETONIDE 0.1 % EX CREA: 1.0000 | TOPICAL_CREAM | Freq: Two times a day (BID) | CUTANEOUS | 0 refills | Status: DC

## 2023-12-04 MED ORDER — CETIRIZINE HCL 10 MG PO TABS: 10.0000 mg | ORAL_TABLET | Freq: Every day | ORAL | 11 refills | Status: DC

## 2023-12-04 NOTE — Assessment & Plan Note (Signed)
 Persistent itching of the scalp without flaking or visible rash. Failed over-the-counter treatments including Head and Shoulders, T-gel, and 1% hydrocortisone. No other areas of dry skin or eczema. No history of similar symptoms. -Prescribe triamcinolone  cream for topical application. -Prescribe cetirizine  10mg  daily for potential allergic component. -Prescribe hydroxyzine  for severe itching, to be taken at night due to potential sedative effect. -Consider dermatology referral if symptoms persist or worsen.

## 2023-12-04 NOTE — Progress Notes (Signed)
 Subjective:  Patient ID: Catherine Stokes, female    DOB: 1951-06-05  Age: 73 y.o. MRN: [id number]  Discussed the use of AI scribe software for clinical note transcription with the patient, who gave verbal consent to proceed.   Chief Complaint  Patient presents with   Hair/Scalp Problem    HPI   Patient presents with itchy scalp around hair line and above the ears. Symptoms have persisted for around a month. Patient states that it has not responded to over-the-counter treatments including Head and Shoulders shampoo, T gel shampoo, and Scalpison (1% hydrocortisone). The itching is severe and localized to the scalp, with no other areas of dry skin or eczema reported. The patient denies any changes in shampoo or other hair products. They have not experienced this issue before, and it is not associated with flaking or visible irritation. The patient has tried Benadryl, which may have provided some relief, but the itching persists. They do not take any daily antihistamines such as Zyrtec . The patient has not seen a dermatologist for this issue and prefers not to unless necessary.     12/04/2023   11:03 AM 10/31/2023    8:09 AM 06/04/2023    1:09 PM 05/01/2023    8:17 AM 02/03/2022    8:17 AM  Depression screen PHQ 2/9  Decreased Interest 0 0 0 0 0  Down, Depressed, Hopeless 0 0 0 0 0  PHQ - 2 Score 0 0 0 0 0  Altered sleeping 0 0     Tired, decreased energy 0 0     Change in appetite 0 0     Feeling bad or failure about yourself  0 0     Trouble concentrating 0 0     Moving slowly or fidgety/restless 0 0     Suicidal thoughts 0 0     PHQ-9 Score 0 0     Difficult doing work/chores Not difficult at all Not difficult at all           12/04/2023   11:03 AM  Fall Risk   Falls in the past year? 0  Number falls in past yr: 0  Injury with Fall? 0  Risk for fall due to : No Fall Risks  Follow up Falls evaluation completed    Patient Care Team: Nicholaus Credit, DEVONNA as PCP - General (Physician  Assistant)   Review of Systems  Constitutional:  Negative for chills, diaphoresis, fatigue and fever.  HENT:  Negative for congestion, ear pain and sinus pain.   Respiratory:  Negative for cough and shortness of breath.   Cardiovascular:  Negative for chest pain.  Gastrointestinal:  Negative for abdominal pain, constipation, nausea and vomiting.  Genitourinary:  Negative for dysuria.  Musculoskeletal:  Negative for arthralgias.  Skin:  Negative for rash.       Pruritus of the scalp   Neurological:  Negative for weakness and headaches.  Psychiatric/Behavioral:  Negative for dysphoric mood. The patient is not nervous/anxious.     Current Outpatient Medications on File Prior to Visit  Medication Sig Dispense Refill   Blood Glucose Monitoring Suppl (ONETOUCH VERIO FLEX SYSTEM) w/Device KIT Inject 1 each as directed daily.     Blood Glucose Monitoring Suppl DEVI 1 each by Does not apply route in the morning, at noon, and at bedtime. May substitute to any manufacturer covered by patient's insurance. 1 each 0   Lancets (ONETOUCH DELICA PLUS LANCET33G) MISC Apply 100 each topically daily.  meloxicam  (MOBIC ) 15 MG tablet TAKE 1 TABLET(15 MG) BY MOUTH DAILY 30 tablet 3   metFORMIN  (GLUCOPHAGE ) 500 MG tablet TAKE 1 TABLET(500 MG) BY MOUTH TWICE DAILY 180 tablet 0   pravastatin  (PRAVACHOL ) 40 MG tablet TAKE 1 TABLET(40 MG) BY MOUTH DAILY 90 tablet 0   zolpidem  (AMBIEN ) 10 MG tablet TAKE 1 TABLET(10 MG) BY MOUTH AT BEDTIME AS NEEDED 30 tablet 1   No current facility-administered medications on file prior to visit.   Past Medical History:  Diagnosis Date   Prediabetes    Past Surgical History:  Procedure Laterality Date   CHOLECYSTECTOMY  07/24/2012   Procedure: LAPAROSCOPIC CHOLECYSTECTOMY WITH INTRAOPERATIVE CHOLANGIOGRAM;  Surgeon: Donnice KATHEE Lunger, MD;  Location: [facility];  Service: General;  Laterality: N/A;   HERNIA REPAIR  2005   umbilical   TUBAL LIGATION  1980    Family History   Problem Relation Age of Onset   Diabetes Mother    Heart disease Mother    Social History   Socioeconomic History   Marital status: Married    Spouse name: Not on file   Number of children: Not on file   Years of education: Not on file   Highest education level: Not on file  Occupational History   Occupation: Farm  Tobacco Use   Smoking status: Never   Smokeless tobacco: Never  Substance and Sexual Activity   Alcohol use: No   Drug use: No   Sexual activity: Not Currently  Other Topics Concern   Not on file  Social History Narrative   Not on file   Social Drivers of Health   Financial Resource Strain: Low Risk  (06/04/2023)   Overall Financial Resource Strain (CARDIA)    Difficulty of Paying Living Expenses: Not hard at all  Food Insecurity: No Food Insecurity (06/04/2023)   Hunger Vital Sign    Worried About Running Out of Food in the Last Year: Never true    Ran Out of Food in the Last Year: Never true  Transportation Needs: No Transportation Needs (06/04/2023)   PRAPARE - Administrator, Civil Service (Medical): No    Lack of Transportation (Non-Medical): No  Physical Activity: Inactive (06/04/2023)   Exercise Vital Sign    Days of Exercise per Week: 0 days    Minutes of Exercise per Session: 0 min  Stress: No Stress Concern Present (06/04/2023)   [employer] of Occupational Health - Occupational Stress Questionnaire    Feeling of Stress : Not at all  Social Connections: Moderately Integrated (06/04/2023)   Social Connection and Isolation Panel [NHANES]    Frequency of Communication with Friends and Family: More than three times a week    Frequency of Social Gatherings with Friends and Family: More than three times a week    Attends Religious Services: More than 4 times per year    Active Member of [employer] or Organizations: No    Attends Engineer, Structural: Never    Marital Status: Married    Objective:  BP 120/64 (BP Location: Left Arm,  Patient Position: Sitting)   Pulse 73   Temp 97.6 F (36.4 C) (Temporal)   Resp 16   Ht 5' 6 (1.676 m)   Wt 136 lb 12.8 oz (62.1 kg)   SpO2 96%   BMI 22.08 kg/m      12/04/2023   10:57 AM 10/31/2023    8:05 AM 05/01/2023    8:16 AM  BP/Weight  Systolic BP  120 122 118  Diastolic BP 64 70 66  Wt. (Lbs) 136.8 140.8 138.8  BMI 22.08 kg/m2 22.73 kg/m2 22.4 kg/m2    Physical Exam Vitals reviewed.  Constitutional:      General: She is not in acute distress.    Appearance: Normal appearance.  Cardiovascular:     Rate and Rhythm: Normal rate and regular rhythm.     Heart sounds: Normal heart sounds. No murmur heard. Pulmonary:     Effort: Pulmonary effort is normal.     Breath sounds: Normal breath sounds. No wheezing.  Musculoskeletal:        General: Normal range of motion.  Skin:    General: Skin is dry.     Findings: No erythema or rash.  Neurological:     Mental Status: She is alert. Mental status is at baseline.  Psychiatric:        Mood and Affect: Mood normal.        Behavior: Behavior normal.      Lab Results  Component Value Date   WBC 7.7 10/31/2023   HGB 12.1 10/31/2023   HCT 37.7 10/31/2023   PLT 323 10/31/2023   GLUCOSE 84 10/31/2023   CHOL 170 10/31/2023   TRIG 114 10/31/2023   HDL 63 10/31/2023   LDLCALC 87 10/31/2023   ALT 12 10/31/2023   AST 14 10/31/2023   NA 142 10/31/2023   K 5.1 10/31/2023   CL 105 10/31/2023   CREATININE 0.73 10/31/2023   BUN 23 10/31/2023   CO2 24 10/31/2023   TSH 1.720 05/01/2023   HGBA1C 6.0 (H) 10/31/2023   MICROALBUR 30 08/31/2020      Assessment & Plan:    Pruritus Assessment & Plan: Persistent itching of the scalp without flaking or visible rash. Failed over-the-counter treatments including Head and Shoulders, T-gel, and 1% hydrocortisone. No other areas of dry skin or eczema. No history of similar symptoms. -Prescribe triamcinolone  cream for topical application. -Prescribe cetirizine  10mg  daily for  potential allergic component. -Prescribe hydroxyzine  for severe itching, to be taken at night due to potential sedative effect. -Consider dermatology referral if symptoms persist or worsen.  Orders: -     Cetirizine  HCl; Take 1 tablet (10 mg total) by mouth daily.  Dispense: 30 tablet; Refill: 11 -     Triamcinolone  Acetonide; Apply 1 Application topically 2 (two) times daily.  Dispense: 30 g; Refill: 0 -     hydrOXYzine  Pamoate; Take 1 capsule (25 mg total) by mouth every 8 (eight) hours as needed.  Dispense: 30 capsule; Refill: 0     Meds ordered this encounter  Medications   cetirizine  (ZYRTEC ) 10 MG tablet    Sig: Take 1 tablet (10 mg total) by mouth daily.    Dispense:  30 tablet    Refill:  11   triamcinolone  cream (KENALOG ) 0.1 %    Sig: Apply 1 Application topically 2 (two) times daily.    Dispense:  30 g    Refill:  0   hydrOXYzine  (VISTARIL ) 25 MG capsule    Sig: Take 1 capsule (25 mg total) by mouth every 8 (eight) hours as needed.    Dispense:  30 capsule    Refill:  0    No orders of the defined types were placed in this encounter.    Follow-up: Return if symptoms worsen or fail to improve.  An After Visit Summary was printed and given to the patient.  Total time spent on today's visit was 31  minutes, including both face-to-face time and nonface-to-face time personally spent on review of chart (labs and imaging), discussing labs and goals, discussing further work-up, treatment options, referrals to specialist if needed, reviewing outside records if pertinent, answering patient's questions, and coordinating care.    Harrie Cedar, FNP [facility] [phone]

## 2023-12-04 NOTE — Patient Instructions (Addendum)
 Take Cetirizine 10 mg daily in the morning Take Hydroxyzine 25 mg by mouth every 8 hours as needed (BEDTIME) Triamcinolone cream Apply 1 Application topically 2 (two) times daily

## 2023-12-28 ENCOUNTER — Encounter: Payer: Self-pay | Admitting: Gastroenterology

## 2023-12-31 ENCOUNTER — Other Ambulatory Visit: Payer: Self-pay | Admitting: Obstetrics and Gynecology

## 2023-12-31 DIAGNOSIS — Z1231 Encounter for screening mammogram for malignant neoplasm of breast: Secondary | ICD-10-CM

## 2024-01-03 DIAGNOSIS — L821 Other seborrheic keratosis: Secondary | ICD-10-CM | POA: Diagnosis not present

## 2024-01-03 DIAGNOSIS — L219 Seborrheic dermatitis, unspecified: Secondary | ICD-10-CM | POA: Diagnosis not present

## 2024-01-07 ENCOUNTER — Other Ambulatory Visit: Payer: Self-pay | Admitting: Physician Assistant

## 2024-01-07 DIAGNOSIS — R7303 Prediabetes: Secondary | ICD-10-CM

## 2024-01-08 ENCOUNTER — Other Ambulatory Visit: Payer: Self-pay | Admitting: Family Medicine

## 2024-01-11 ENCOUNTER — Ambulatory Visit
Admission: RE | Admit: 2024-01-11 | Discharge: 2024-01-11 | Disposition: A | Discharge status: Home / Self Care | Payer: PPO | Source: Ambulatory Visit | Attending: Obstetrics and Gynecology | Admitting: Obstetrics and Gynecology

## 2024-01-11 DIAGNOSIS — Z1231 Encounter for screening mammogram for malignant neoplasm of breast: Secondary | ICD-10-CM | POA: Diagnosis not present

## 2024-01-23 ENCOUNTER — Other Ambulatory Visit: Payer: Self-pay

## 2024-01-23 MED ORDER — ZOLPIDEM TARTRATE 10 MG PO TABS: ORAL_TABLET | ORAL | 0 refills | Status: DC

## 2024-01-23 NOTE — Telephone Encounter (Signed)
 Patient called stating she got her Zolpidem refilled on February 13 th, 2025 but she cannot find the rx. Patient wants to know can she get an refill due to losing the medication.

## 2024-01-24 ENCOUNTER — Telehealth: Payer: Self-pay

## 2024-01-24 ENCOUNTER — Other Ambulatory Visit: Payer: Self-pay

## 2024-01-24 NOTE — Telephone Encounter (Signed)
 Please call pharmacy regarding this issue

## 2024-01-24 NOTE — Telephone Encounter (Signed)
 Please advice  Copied from CRM [id number]. Topic: Clinical - Prescription Issue >> Jan 24, 2024  2:40 PM Victorino Dike T wrote: Reason for CRM: zolpidem (AMBIEN) 10 MG tablet- it was not noted that it was a refill due to loss so the pharmacy will not fill it early, please correct and then call patient to inform [phone]

## 2024-01-25 ENCOUNTER — Other Ambulatory Visit: Payer: Self-pay | Admitting: Physician Assistant

## 2024-01-25 MED ORDER — ZOLPIDEM TARTRATE 10 MG PO TABS: ORAL_TABLET | ORAL | 0 refills | Status: DC

## 2024-01-25 NOTE — Telephone Encounter (Signed)
Called pharmacy, made them aware.

## 2024-01-25 NOTE — Telephone Encounter (Signed)
 Not able to contact pharmacy due to the phone line hanging up, Provider sent new RX with comments for the pharmacy okay to refill for patient

## 2024-02-15 ENCOUNTER — Ambulatory Visit: Payer: PPO | Admitting: *Deleted

## 2024-02-15 VITALS — Ht 66.0 in | Wt 135.0 lb

## 2024-02-15 DIAGNOSIS — Z1211 Encounter for screening for malignant neoplasm of colon: Secondary | ICD-10-CM

## 2024-02-15 MED ORDER — SUFLAVE 178.7 G PO SOLR: 1.0000 | Freq: Once | ORAL | 0 refills | Status: AC

## 2024-02-15 NOTE — Progress Notes (Addendum)
 Pt's name and DOB verified at the beginning of the pre-visit wit 2 identifiers  Pt denies any difficulty with ambulating,sitting, laying down or rolling side to side  Pt has no issues with ambulation   Pt has no issues moving head neck or swallowing  No egg or soy allergy known to patient   No issues known to pt with past sedation with any surgeries or procedures  No FH of Malignant Hyperthermia  Pt is not on diet pills or shots  Pt is not on home 02   Pt is not on blood thinners   Pt has frequent issues with constipation RN instructed pt to use Miralax per bottles instructions a week before prep days. Pt states they will  Pt is not on dialysis  Pt denise any abnormal heart rhythms   Pt denies any upcoming cardiac testing  Patient's chart reviewed by Cathlyn Parsons CNRA prior to pre-visit and patient appropriate for the [facility].  Pre-visit completed and red dot placed by patient's name on their procedure day (on provider's schedule).   Visit by phone  Pt states weight is 135 lb  IInstructions reviewed. Pt given [facility], [facility] main # and MD on call # prior to instructions.  Pt states understanding of instructions. Instructed to review again prior to procedure. Pt states they will.   Informed pt that they will receive a text or  call from [facility] regarding there prep me

## 2024-02-18 ENCOUNTER — Ambulatory Visit: Payer: Self-pay

## 2024-02-18 NOTE — Telephone Encounter (Signed)
  Chief Complaint: diarrhea Symptoms: diarrhea, abdominal cramping with BM Frequency: 8 days Pertinent Negatives: Patient denies blood in stool, nausea, vomiting, fever, decreased oral intake, signs of dehydration Disposition: [] ED /[] Urgent Care (no appt availability in office) / [x] Appointment(In office/virtual)/ []  [place] [facility]/ [] Home Care/ [] Refused Recommended Disposition /[] [place] [facility]/ []  Follow-up with PCP Additional Notes: Patient calls reporting 4 episodes of diarrhea in the last 24 hours. States she has been experiencing diarrhea in the mornings for the last 8 days. Denies all other symptoms except cramping when needing to have BM. Per protocol, patient to be evaluated within 24 hours. First available appointment with PCP 02/19/24 at 0940- patient scheduled accordingly. Care advice reviewed, patient verbalized understanding and denies further questions at this time. Alerting PCP for review.    Reason for Disposition  [1] MODERATE diarrhea (e.g., 4-6 times / day more than normal) AND [2] present > 48 hours (2 days)  Answer Assessment - Initial Assessment Questions 1. DIARRHEA SEVERITY: "How bad is the diarrhea?" "How many more stools have you had in the past 24 hours than normal?"    - NO DIARRHEA (SCALE 0)   - MILD (SCALE 1-3): Few loose or mushy BMs; increase of 1-3 stools over normal daily number of stools; mild increase in ostomy output.   -  MODERATE (SCALE 4-7): Increase of 4-6 stools daily over normal; moderate increase in ostomy output.   -  SEVERE (SCALE 8-10; OR "WORST POSSIBLE"): Increase of 7 or more stools daily over normal; moderate increase in ostomy output; incontinence.     4 times this morning 2. ONSET: "When did the diarrhea begin?"      8 days 3. BM CONSISTENCY: "How loose or watery is the diarrhea?"      Watery  4. VOMITING: "Are you also vomiting?" If Yes, ask: "How many times in the past 24 hours?"      Denies 5. ABDOMEN PAIN: "Are you  having any abdomen pain?" If Yes, ask: "What does it feel like?" (e.g., crampy, dull, intermittent, constant)      Only when having to have a bowel movement, cramping 6. ABDOMEN PAIN SEVERITY: If present, ask: "How bad is the pain?"  (e.g., Scale 1-10; mild, moderate, or severe)   - MILD (1-3): doesn't interfere with normal activities, abdomen soft and not tender to touch    - MODERATE (4-7): interferes with normal activities or awakens from sleep, abdomen tender to touch    - SEVERE (8-10): excruciating pain, doubled over, unable to do any normal activities       Denies 7. ORAL INTAKE: If vomiting, "Have you been able to drink liquids?" "How much liquids have you had in the past 24 hours?"     Yes, drinking and eating appropriately 8. HYDRATION: "Any signs of dehydration?" (e.g., dry mouth [not just dry lips], too weak to stand, dizziness, new weight loss) "When did you last urinate?"     Denies 9. EXPOSURE: "Have you traveled to a foreign country recently?" "Have you been exposed to anyone with diarrhea?" "Could you have eaten any food that was spoiled?"     Denies 10. ANTIBIOTIC USE: "Are you taking antibiotics now or have you taken antibiotics in the past 2 months?"       Denies 11. OTHER SYMPTOMS: "Do you have any other symptoms?" (e.g., fever, blood in stool)       Denies  Protocols used: Diarrhea-A-AH

## 2024-02-19 ENCOUNTER — Ambulatory Visit (INDEPENDENT_AMBULATORY_CARE_PROVIDER_SITE_OTHER): Admitting: Physician Assistant

## 2024-02-19 ENCOUNTER — Encounter: Payer: Self-pay | Admitting: Physician Assistant

## 2024-02-19 VITALS — BP 118/70 | HR 71 | Temp 98.2°F | Resp 18 | Ht 66.0 in | Wt 138.8 lb

## 2024-02-19 DIAGNOSIS — R197 Diarrhea, unspecified: Secondary | ICD-10-CM | POA: Diagnosis not present

## 2024-02-19 NOTE — Progress Notes (Signed)
 Acute Office Visit  Subjective:    Patient ID: Catherine Stokes, female    DOB: 1951-03-11, 73 y.o.   MRN: [id number]  Chief Complaint  Patient presents with   Diarrhea    HPI: Patient is in today for complaints of diarrhea for the past 10 days.  She states for over a week she was going more than 3-5 times daily and was very watery.  Today is the first day it is more loose than watery.  Denies melena or hematochezia.  She denies nausea or vomiting and appetite has been normal.  She has had some bloating and gassiness.  Denies fever She is on well water She is scheduled for screening colonoscopy on 03/05/24 with Dr Chales Abrahams   Current Outpatient Medications:    Blood Glucose Monitoring Suppl (ONETOUCH VERIO FLEX SYSTEM) w/Device KIT, Inject 1 each as directed daily., Disp: , Rfl:    Blood Glucose Monitoring Suppl DEVI, 1 each by Does not apply route in the morning, at noon, and at bedtime. May substitute to any manufacturer covered by patient's insurance., Disp: 1 each, Rfl: 0   cetirizine (ZYRTEC) 10 MG tablet, Take 1 tablet (10 mg total) by mouth daily., Disp: 30 tablet, Rfl: 11   hydrOXYzine (VISTARIL) 25 MG capsule, Take 1 capsule (25 mg total) by mouth every 8 (eight) hours as needed., Disp: 30 capsule, Rfl: 0   ketoconazole (NIZORAL) 2 % shampoo, SMARTSIG:Topical 2-3 Times Weekly, Disp: , Rfl:    Lancets (ONETOUCH DELICA PLUS LANCET33G) MISC, USE THREE TIMES DAILY, EVERY MORNING, AT NOON, AND EVERY NIGHT AT BEDTIME, TO TEST BLOOD SUGAR, Disp: 100 each, Rfl: 1   meloxicam (MOBIC) 15 MG tablet, TAKE 1 TABLET(15 MG) BY MOUTH DAILY, Disp: 30 tablet, Rfl: 3   metFORMIN (GLUCOPHAGE) 500 MG tablet, TAKE 1 TABLET(500 MG) BY MOUTH TWICE DAILY, Disp: 180 tablet, Rfl: 0   ONETOUCH ULTRA test strip, USE THREE TIMES DAILY, EVERY MORNING, AT NOON, AND EVERY NIGHT AT BEDTIME, TO TEST BLOOD SUGAR, Disp: 100 strip, Rfl: 0   pravastatin (PRAVACHOL) 40 MG tablet, TAKE 1 TABLET(40 MG) BY MOUTH DAILY, Disp:  90 tablet, Rfl: 0   triamcinolone cream (KENALOG) 0.1 %, Apply 1 Application topically 2 (two) times daily., Disp: 30 g, Rfl: 0   zolpidem (AMBIEN) 10 MG tablet, TAKE 1 TABLET(10 MG) BY MOUTH AT BEDTIME AS NEEDED, Disp: 30 tablet, Rfl: 0  No Known Allergies  ROS CONSTITUTIONAL: Negative for chills, fatigue, fever,   CARDIOVASCULAR: Negative for chest pain, dizziness, palpitations and pedal edema.  RESPIRATORY: Negative for recent cough and dyspnea.  GASTROINTESTINAL: see HPI      Objective:    PHYSICAL EXAM:   BP 118/70   Pulse 71   Temp 98.2 F (36.8 C) (Temporal)   Resp 18   Ht 5\' 6"  (1.676 m)   Wt 138 lb 12.8 oz (63 kg)   SpO2 97%   BMI 22.40 kg/m    GEN: Well nourished, well developed, in no acute distress   Cardiac: RRR; no murmurs, Respiratory:  normal respiratory rate and pattern with no distress - normal breath sounds with no rales, rhonchi, wheezes or rubs GI: normal bowel sounds, no masses or tenderness  Psych: euthymic mood, appropriate affect and demeanor     Assessment & Plan:    Diarrhea, unspecified type -     Cdiff NAA+O+P+Stool Culture   Follow up with Dr Chales Abrahams for scheduled colonoscopy  Follow-up: Return if symptoms worsen or fail to improve.  An After Visit Summary was printed and given to the patient.  Jettie Pagan [facility] [phone]

## 2024-02-20 DIAGNOSIS — R197 Diarrhea, unspecified: Secondary | ICD-10-CM | POA: Diagnosis not present

## 2024-02-26 ENCOUNTER — Encounter: Payer: Self-pay | Admitting: Physician Assistant

## 2024-02-26 ENCOUNTER — Ambulatory Visit: Payer: Self-pay | Admitting: Physician Assistant

## 2024-02-26 ENCOUNTER — Ambulatory Visit: Payer: Self-pay

## 2024-02-26 ENCOUNTER — Ambulatory Visit (INDEPENDENT_AMBULATORY_CARE_PROVIDER_SITE_OTHER): Admitting: Physician Assistant

## 2024-02-26 VITALS — BP 116/64 | HR 79 | Temp 98.3°F | Resp 18 | Ht 66.0 in | Wt 137.2 lb

## 2024-02-26 DIAGNOSIS — R197 Diarrhea, unspecified: Secondary | ICD-10-CM

## 2024-02-26 DIAGNOSIS — N342 Other urethritis: Secondary | ICD-10-CM | POA: Diagnosis not present

## 2024-02-26 DIAGNOSIS — R300 Dysuria: Secondary | ICD-10-CM | POA: Diagnosis not present

## 2024-02-26 LAB — POCT URINALYSIS DIP (CLINITEK)
Bilirubin, UA: NEGATIVE
Blood, UA: NEGATIVE
Glucose, UA: NEGATIVE mg/dL
Ketones, POC UA: NEGATIVE mg/dL
Leukocytes, UA: NEGATIVE
Nitrite, UA: NEGATIVE
POC PROTEIN,UA: NEGATIVE
Spec Grav, UA: 1.01 (ref 1.010–1.025)
Urobilinogen, UA: 0.2 U/dL
pH, UA: 6 (ref 5.0–8.0)

## 2024-02-26 LAB — CDIFF NAA+O+P+STOOL CULTURE
E coli, Shiga toxin Assay: NEGATIVE
Toxigenic C. Difficile by PCR: NEGATIVE

## 2024-02-26 MED ORDER — CIPROFLOXACIN HCL 500 MG PO TABS: 500.0000 mg | ORAL_TABLET | Freq: Two times a day (BID) | ORAL | 0 refills | Status: AC

## 2024-02-26 NOTE — Progress Notes (Signed)
 Acute Office Visit  Subjective:    Patient ID: Catherine Stokes, female    DOB: 1951-07-03, 73 y.o.   MRN: [id number]  Chief Complaint  Patient presents with   Dysuria    HPI: Patient is in today for complaints of dysuria that started on Saturday - she has had several episodes since then as well Denies hematuria  - some mild urgency Pt is still having loose stools (for the past 2 weeks) - occasionally formed stool - no blood in bms Of note pt is scheduled for colonoscopy next week and stool studies have been done with normal preliminary results   Current Outpatient Medications:    Blood Glucose Monitoring Suppl (ONETOUCH VERIO FLEX SYSTEM) w/Device KIT, Inject 1 each as directed daily., Disp: , Rfl:    Blood Glucose Monitoring Suppl DEVI, 1 each by Does not apply route in the morning, at noon, and at bedtime. May substitute to any manufacturer covered by patient's insurance., Disp: 1 each, Rfl: 0   cetirizine (ZYRTEC) 10 MG tablet, Take 1 tablet (10 mg total) by mouth daily., Disp: 30 tablet, Rfl: 11   ciprofloxacin (CIPRO) 500 MG tablet, Take 1 tablet (500 mg total) by mouth 2 (two) times daily for 3 days., Disp: 6 tablet, Rfl: 0   hydrOXYzine (VISTARIL) 25 MG capsule, Take 1 capsule (25 mg total) by mouth every 8 (eight) hours as needed., Disp: 30 capsule, Rfl: 0   ketoconazole (NIZORAL) 2 % shampoo, SMARTSIG:Topical 2-3 Times Weekly, Disp: , Rfl:    Lancets (ONETOUCH DELICA PLUS LANCET33G) MISC, USE THREE TIMES DAILY, EVERY MORNING, AT NOON, AND EVERY NIGHT AT BEDTIME, TO TEST BLOOD SUGAR, Disp: 100 each, Rfl: 1   meloxicam (MOBIC) 15 MG tablet, TAKE 1 TABLET(15 MG) BY MOUTH DAILY, Disp: 30 tablet, Rfl: 3   metFORMIN (GLUCOPHAGE) 500 MG tablet, TAKE 1 TABLET(500 MG) BY MOUTH TWICE DAILY, Disp: 180 tablet, Rfl: 0   ONETOUCH ULTRA test strip, USE THREE TIMES DAILY, EVERY MORNING, AT NOON, AND EVERY NIGHT AT BEDTIME, TO TEST BLOOD SUGAR, Disp: 100 strip, Rfl: 0   pravastatin  (PRAVACHOL) 40 MG tablet, TAKE 1 TABLET(40 MG) BY MOUTH DAILY, Disp: 90 tablet, Rfl: 0   triamcinolone cream (KENALOG) 0.1 %, Apply 1 Application topically 2 (two) times daily., Disp: 30 g, Rfl: 0   zolpidem (AMBIEN) 10 MG tablet, TAKE 1 TABLET(10 MG) BY MOUTH AT BEDTIME AS NEEDED, Disp: 30 tablet, Rfl: 0  No Known Allergies  ROS CONSTITUTIONAL: Negative for chills, fatigue, fever,  E/N/T: Negative for ear pain, nasal congestion and sore throat.  CARDIOVASCULAR: Negative for chest pain RESPIRATORY: Negative for recent cough and dyspnea.  GASTROINTESTINAL: see HPI GU - see HPI      Objective:    PHYSICAL EXAM:   BP 116/64   Pulse 79   Temp 98.3 F (36.8 C) (Temporal)   Resp 18   Ht 5\' 6"  (1.676 m)   Wt 137 lb 3.2 oz (62.2 kg)   SpO2 95%   BMI 22.14 kg/m    GEN: Well nourished, well developed, in no acute distress   Cardiac: RRR; no murmurs, Respiratory:  normal respiratory rate and pattern with no distress - normal breath sounds with no rales, rhonchi, wheezes or rubs GI: normal bowel sounds, no masses or tenderness  Psych: euthymic mood, appropriate affect and demeanor Office Visit on 02/26/2024  Component Date Value Ref Range Status   Color, UA 02/26/2024 yellow  yellow Final   Clarity, UA 02/26/2024  clear  clear Final   Glucose, UA 02/26/2024 negative  negative mg/dL Final   Bilirubin, UA 16/08/9603 negative  negative Final   Ketones, POC UA 02/26/2024 negative  negative mg/dL Final   Spec Grav, UA 54/07/8118 1.010  1.010 - 1.025 Final   Blood, UA 02/26/2024 negative  negative Final   pH, UA 02/26/2024 6.0  5.0 - 8.0 Final   POC PROTEIN,UA 02/26/2024 negative  negative, trace Final   Urobilinogen, UA 02/26/2024 0.2  0.2 or 1.0 E.U./dL Final   Nitrite, UA 14/78/2956 Negative  Negative Final   Leukocytes, UA 02/26/2024 Negative  Negative Final       Assessment & Plan:    Urethritis -     POCT URINALYSIS DIP (CLINITEK) -     CBC with Differential/Platelet -      Comprehensive metabolic panel with GFR -     Urine Culture -     Ciprofloxacin HCl; Take 1 tablet (500 mg total) by mouth 2 (two) times daily for 3 days.  Dispense: 6 tablet; Refill: 0  Diarrhea, unspecified type -     CBC with Differential/Platelet -     Comprehensive metabolic panel with GFR Increase fiber in diet - follow up for colonoscopy next week as scheduled Stool studies results pending    Follow-up: Return if symptoms worsen or fail to improve.  An After Visit Summary was printed and given to the patient.  Jettie Pagan [facility] [phone]

## 2024-02-26 NOTE — Telephone Encounter (Signed)
 This RN called the pt for triage and scheduling. Pt alerted RN she was made an appt today at 1320 by another nurse. No further action needed.

## 2024-02-26 NOTE — Telephone Encounter (Signed)
 Copied from CRM [id number]. Topic: Clinical - Red Word Triage >> Feb 26, 2024  8:13 AM Carlatta H wrote: [facility] that prompted transfer to Nurse Triage: Patient has Diarrhea for over 2 weeks//   Chief Complaint: diarrhea Symptoms: Gassy discomfort  Frequency: intermittent Pertinent Negatives: Patient denies Fever Disposition: [] ED /[] Urgent Care (no appt availability in office) / [x] Appointment(In office/virtual)/ []  [place] [facility]/ [] Home Care/ [] Refused Recommended Disposition /[] [place] [facility]/ []  Follow-up with PCP Additional Notes: Seen on 3/25 and provided stool sample, still awaiting results. Reports 6 episodes in last 24 hours Reason for Disposition  [1] MODERATE diarrhea (e.g., 4-6 times / day more than normal) AND [2] present > 48 hours (2 days)  Answer Assessment - Initial Assessment Questions 1. DIARRHEA SEVERITY: "How bad is the diarrhea?" "How many more stools have you had in the past 24 hours than normal?"    - NO DIARRHEA (SCALE 0)   - MILD (SCALE 1-3): Few loose or mushy BMs; increase of 1-3 stools over normal daily number of stools; mild increase in ostomy output.   -  MODERATE (SCALE 4-7): Increase of 4-6 stools daily over normal; moderate increase in ostomy output.   -  SEVERE (SCALE 8-10; OR "WORST POSSIBLE"): Increase of 7 or more stools daily over normal; moderate increase in ostomy output; incontinence.     Moderate diarrhea  2. ONSET: "When did the diarrhea begin?"      2 weeks ago  3. BM CONSISTENCY: "How loose or watery is the diarrhea?"      Liquid diarrhea  4. VOMITING: "Are you also vomiting?" If Yes, ask: "How many times in the past 24 hours?"      No  5. ABDOMEN PAIN: "Are you having any abdomen pain?" If Yes, ask: "What does it feel like?" (e.g., crampy, dull, intermittent, constant)     Hurts "some" and has a little gas  6. ABDOMEN PAIN SEVERITY: If present, ask: "How bad is the pain?"  (e.g., Scale 1-10; mild, moderate, or severe)    - MILD (1-3): doesn't interfere with normal activities, abdomen soft and not tender to touch    - MODERATE (4-7): interferes with normal activities or awakens from sleep, abdomen tender to touch    - SEVERE (8-10): excruciating pain, doubled over, unable to do any normal activities       Mild  7. ORAL INTAKE: If vomiting, "Have you been able to drink liquids?" "How much liquids have you had in the past 24 hours?"     No  8. HYDRATION: "Any signs of dehydration?" (e.g., dry mouth [not just dry lips], too weak to stand, dizziness, new weight loss) "When did you last urinate?"     Does not feel dehydrated  9. EXPOSURE: "Have you traveled to a foreign country recently?" "Have you been exposed to anyone with diarrhea?" "Could you have eaten any food that was spoiled?"     No  10. ANTIBIOTIC USE: "Are you taking antibiotics now or have you taken antibiotics in the past 2 months?"       No  11. OTHER SYMPTOMS: "Do you have any other symptoms?" (e.g., fever, blood in stool)       Some burning with urination  12. PREGNANCY: "Is there any chance you are pregnant?" "When was your last menstrual period?"       No  Protocols used: [facility]

## 2024-02-27 ENCOUNTER — Encounter: Payer: Self-pay | Admitting: Physician Assistant

## 2024-02-27 LAB — CBC WITH DIFFERENTIAL/PLATELET
Basophils Absolute: 0.1 10*3/uL (ref 0.0–0.2)
Basos: 1 %
EOS (ABSOLUTE): 0.1 10*3/uL (ref 0.0–0.4)
Eos: 1 %
Hematocrit: 34.3 % (ref 34.0–46.6)
Hemoglobin: 11.2 g/dL (ref 11.1–15.9)
Immature Grans (Abs): 0 10*3/uL (ref 0.0–0.1)
Immature Granulocytes: 0 %
Lymphocytes Absolute: 2 10*3/uL (ref 0.7–3.1)
Lymphs: 24 %
MCH: 30.1 pg (ref 26.6–33.0)
MCHC: 32.7 g/dL (ref 31.5–35.7)
MCV: 92 fL (ref 79–97)
Monocytes Absolute: 0.6 10*3/uL (ref 0.1–0.9)
Monocytes: 7 %
Neutrophils Absolute: 5.6 10*3/uL (ref 1.4–7.0)
Neutrophils: 67 %
Platelets: 343 10*3/uL (ref 150–450)
RBC: 3.72 x10E6/uL — ABNORMAL LOW (ref 3.77–5.28)
RDW: 12.4 % (ref 11.7–15.4)
WBC: 8.5 10*3/uL (ref 3.4–10.8)

## 2024-02-27 LAB — COMPREHENSIVE METABOLIC PANEL WITH GFR
ALT: 14 IU/L (ref 0–32)
AST: 17 IU/L (ref 0–40)
Albumin: 3.7 g/dL — ABNORMAL LOW (ref 3.8–4.8)
Alkaline Phosphatase: 71 IU/L (ref 44–121)
BUN/Creatinine Ratio: 30 — ABNORMAL HIGH (ref 12–28)
BUN: 22 mg/dL (ref 8–27)
Bilirubin Total: <0.2 mg/dL (ref 0.0–1.2)
CO2: 20 mmol/L (ref 20–29)
Calcium: 9.7 mg/dL (ref 8.7–10.3)
Chloride: 107 mmol/L — ABNORMAL HIGH (ref 96–106)
Creatinine, Ser: 0.73 mg/dL (ref 0.57–1.00)
Globulin, Total: 2.3 g/dL (ref 1.5–4.5)
Glucose: 84 mg/dL (ref 70–99)
Potassium: 4.4 mmol/L (ref 3.5–5.2)
Sodium: 140 mmol/L (ref 134–144)
Total Protein: 6 g/dL (ref 6.0–8.5)
eGFR: 87 mL/min/{1.73_m2} (ref >59)

## 2024-02-27 LAB — URINE CULTURE

## 2024-02-29 ENCOUNTER — Other Ambulatory Visit: Payer: Self-pay | Admitting: Physician Assistant

## 2024-02-29 DIAGNOSIS — E782 Mixed hyperlipidemia: Secondary | ICD-10-CM

## 2024-03-04 ENCOUNTER — Encounter: Payer: Self-pay | Admitting: Gastroenterology

## 2024-03-05 ENCOUNTER — Ambulatory Visit (AMBULATORY_SURGERY_CENTER): Payer: PPO | Admitting: Gastroenterology

## 2024-03-05 ENCOUNTER — Encounter: Payer: Self-pay | Admitting: Gastroenterology

## 2024-03-05 VITALS — BP 113/54 | HR 63 | Temp 97.4°F | Resp 12 | Ht 66.0 in | Wt 135.0 lb

## 2024-03-05 DIAGNOSIS — K5730 Diverticulosis of large intestine without perforation or abscess without bleeding: Secondary | ICD-10-CM

## 2024-03-05 DIAGNOSIS — Z1211 Encounter for screening for malignant neoplasm of colon: Secondary | ICD-10-CM

## 2024-03-05 DIAGNOSIS — Z860100 Personal history of colon polyps, unspecified: Secondary | ICD-10-CM | POA: Diagnosis not present

## 2024-03-05 DIAGNOSIS — E785 Hyperlipidemia, unspecified: Secondary | ICD-10-CM | POA: Diagnosis not present

## 2024-03-05 DIAGNOSIS — K640 First degree hemorrhoids: Secondary | ICD-10-CM | POA: Diagnosis not present

## 2024-03-05 DIAGNOSIS — R7303 Prediabetes: Secondary | ICD-10-CM | POA: Diagnosis not present

## 2024-03-05 MED ORDER — SODIUM CHLORIDE 0.9 % IV SOLN: 500.0000 mL | INTRAVENOUS | Status: DC

## 2024-03-05 NOTE — Patient Instructions (Signed)
 Educational handout provided to patient related to Hemorrhoids, high fiber, and Diverticulosis  High fiber diet  Continue present medications  Repeat colonoscopy if not recommended for screening purposes.   YOU HAD AN ENDOSCOPIC PROCEDURE TODAY AT THE [facility]:   Refer to the procedure report that was given to you for any specific questions about what was found during the examination.  If the procedure report does not answer your questions, please call your gastroenterologist to clarify.  If you requested that your care partner not be given the details of your procedure findings, then the procedure report has been included in a sealed envelope for you to review at your convenience later.  YOU SHOULD EXPECT: Some feelings of bloating in the abdomen. Passage of more gas than usual.  Walking can help get rid of the air that was put into your GI tract during the procedure and reduce the bloating. If you had a lower endoscopy (such as a colonoscopy or flexible sigmoidoscopy) you may notice spotting of blood in your stool or on the toilet paper. If you underwent a bowel prep for your procedure, you may not have a normal bowel movement for a few days.  Please Note:  You might notice some irritation and congestion in your nose or some drainage.  This is from the oxygen used during your procedure.  There is no need for concern and it should clear up in a day or so.  SYMPTOMS TO REPORT IMMEDIATELY:  Following lower endoscopy (colonoscopy or flexible sigmoidoscopy):  Excessive amounts of blood in the stool  Significant tenderness or worsening of abdominal pains  Swelling of the abdomen that is new, acute  Fever of 100F or higher  For urgent or emergent issues, a gastroenterologist can be reached at any hour by calling [phone]. Do not use MyChart messaging for urgent concerns.    DIET:  We do recommend a small meal at first, but then you may proceed to your regular diet.  Drink  plenty of fluids but you should avoid alcoholic beverages for 24 hours.  ACTIVITY:  You should plan to take it easy for the rest of today and you should NOT DRIVE or use heavy machinery until tomorrow (because of the sedation medicines used during the test).    FOLLOW UP: Our staff will call the number listed on your records the next business day following your procedure.  We will call around 7:15- 8:00 am to check on you and address any questions or concerns that you may have regarding the information given to you following your procedure. If we do not reach you, we will leave a message.     If any biopsies were taken you will be contacted by phone or by letter within the next 1-3 weeks.  Please call us at [phone] if you have not heard about the biopsies in 3 weeks.    SIGNATURES/CONFIDENTIALITY: You and/or your care partner have signed paperwork which will be entered into your electronic medical record.  These signatures attest to the fact that that the information above on your After Visit Summary has been reviewed and is understood.  Full responsibility of the confidentiality of this discharge information lies with you and/or your care-partner.

## 2024-03-05 NOTE — Progress Notes (Signed)
 Pt's states no medical or surgical changes since previsit or office visit.

## 2024-03-05 NOTE — Progress Notes (Signed)
 [facility] History and Physical   Primary Care Physician:  Marianne Sofia, PA-C   Reason for Procedure:     CRC screening.  Previous colonoscopy by Dr. Kinnie Scales several years ago.  Plan:      Colon     HPI: Catherine Stokes is a 73 y.o. female    Past Medical History:  Diagnosis Date   Hyperlipidemia    Prediabetes     Past Surgical History:  Procedure Laterality Date   CHOLECYSTECTOMY  07/24/2012   Procedure: LAPAROSCOPIC CHOLECYSTECTOMY WITH INTRAOPERATIVE CHOLANGIOGRAM;  Surgeon: Valarie Merino, MD;  Location: [facility];  Service: General;  Laterality: N/A;   COLONOSCOPY     HERNIA REPAIR  11/28/2003   umbilical   TUBAL LIGATION  11/27/1978    Prior to Admission medications   Medication Sig Start Date End Date Taking? Authorizing Provider  Blood Glucose Monitoring Suppl (ONETOUCH VERIO FLEX SYSTEM) w/Device KIT Inject 1 each as directed daily. 10/31/23  Yes [provider]  Blood Glucose Monitoring Suppl DEVI 1 each by Does not apply route in the morning, at noon, and at bedtime. May substitute to any manufacturer covered by patient's insurance. 10/31/23  Yes Marianne Sofia, PA-C  ketoconazole (NIZORAL) 2 % shampoo SMARTSIG:Topical 2-3 Times Weekly 01/03/24  Yes [provider]  Lancets (ONETOUCH DELICA PLUS LANCET33G) MISC USE THREE TIMES DAILY, EVERY MORNING, AT NOON, AND EVERY NIGHT AT BEDTIME, TO TEST BLOOD SUGAR 01/07/24  Yes Marianne Sofia, PA-C  metFORMIN (GLUCOPHAGE) 500 MG tablet TAKE 1 TABLET(500 MG) BY MOUTH TWICE DAILY 01/08/24  Yes Cox, Kirsten, MD  [facility] ULTRA test strip USE THREE TIMES DAILY, EVERY MORNING, AT NOON, AND EVERY NIGHT AT BEDTIME, TO TEST BLOOD SUGAR 01/07/24  Yes Marianne Sofia, PA-C  pravastatin (PRAVACHOL) 40 MG tablet TAKE 1 TABLET(40 MG) BY MOUTH DAILY 03/03/24  Yes Marianne Sofia, PA-C  triamcinolone cream (KENALOG) 0.1 % Apply 1 Application topically 2 (two) times daily. 12/04/23  Yes Renne Crigler, FNP  zolpidem (AMBIEN) 10 MG  tablet TAKE 1 TABLET(10 MG) BY MOUTH AT BEDTIME AS NEEDED 01/25/24  Yes Marianne Sofia, PA-C  cetirizine (ZYRTEC) 10 MG tablet Take 1 tablet (10 mg total) by mouth daily. 12/04/23   Renne Crigler, FNP  hydrOXYzine (VISTARIL) 25 MG capsule Take 1 capsule (25 mg total) by mouth every 8 (eight) hours as needed. Patient not taking: Reported on 03/05/2024 12/04/23   Renne Crigler, FNP  meloxicam (MOBIC) 15 MG tablet TAKE 1 TABLET(15 MG) BY MOUTH DAILY Patient not taking: Reported on 03/05/2024 11/14/23   Marianne Sofia, PA-C    Current Outpatient Medications  Medication Sig Dispense Refill   Blood Glucose Monitoring Suppl (ONETOUCH VERIO FLEX SYSTEM) w/Device KIT Inject 1 each as directed daily.     Blood Glucose Monitoring Suppl DEVI 1 each by Does not apply route in the morning, at noon, and at bedtime. May substitute to any manufacturer covered by patient's insurance. 1 each 0   ketoconazole (NIZORAL) 2 % shampoo SMARTSIG:Topical 2-3 Times Weekly     Lancets (ONETOUCH DELICA PLUS LANCET33G) MISC USE THREE TIMES DAILY, EVERY MORNING, AT NOON, AND EVERY NIGHT AT BEDTIME, TO TEST BLOOD SUGAR 100 each 1   metFORMIN (GLUCOPHAGE) 500 MG tablet TAKE 1 TABLET(500 MG) BY MOUTH TWICE DAILY 180 tablet 0   ONETOUCH ULTRA test strip USE THREE TIMES DAILY, EVERY MORNING, AT NOON, AND EVERY NIGHT AT BEDTIME, TO TEST BLOOD SUGAR 100 strip 0   pravastatin (PRAVACHOL) 40  MG tablet TAKE 1 TABLET(40 MG) BY MOUTH DAILY 90 tablet 0   triamcinolone cream (KENALOG) 0.1 % Apply 1 Application topically 2 (two) times daily. 30 g 0   zolpidem (AMBIEN) 10 MG tablet TAKE 1 TABLET(10 MG) BY MOUTH AT BEDTIME AS NEEDED 30 tablet 0   cetirizine (ZYRTEC) 10 MG tablet Take 1 tablet (10 mg total) by mouth daily. 30 tablet 11   hydrOXYzine (VISTARIL) 25 MG capsule Take 1 capsule (25 mg total) by mouth every 8 (eight) hours as needed. (Patient not taking: Reported on 03/05/2024) 30 capsule 0   meloxicam (MOBIC) 15 MG tablet TAKE 1 TABLET(15 MG) BY  MOUTH DAILY (Patient not taking: Reported on 03/05/2024) 30 tablet 3   Current Facility-Administered Medications  Medication Dose Route Frequency Provider Last Rate Last Admin   0.9 %  sodium chloride infusion  500 mL Intravenous Continuous Lynann Bologna, MD        Allergies as of 03/05/2024   (No Known Allergies)    Family History  Problem Relation Age of Onset   Diabetes Mother    Heart disease Mother    Breast cancer Neg Hx    Colon cancer Neg Hx    Colon polyps Neg Hx    Esophageal cancer Neg Hx    Rectal cancer Neg Hx    Stomach cancer Neg Hx     Social History   Socioeconomic History   Marital status: Married    Spouse name: Not on file   Number of children: Not on file   Years of education: Not on file   Highest education level: Not on file  Occupational History   Occupation: Farm  Tobacco Use   Smoking status: Never   Smokeless tobacco: Never  Substance and Sexual Activity   Alcohol use: No   Drug use: No   Sexual activity: Not Currently  Other Topics Concern   Not on file  Social History Narrative   Not on file   Social Drivers of Health   Financial Resource Strain: Low Risk  (06/04/2023)   Overall Financial Resource Strain (CARDIA)    Difficulty of Paying Living Expenses: Not hard at all  Food Insecurity: No Food Insecurity (06/04/2023)   Hunger Vital Sign    Worried About Running Out of Food in the Last Year: Never true    Ran Out of Food in the Last Year: Never true  Transportation Needs: No Transportation Needs (06/04/2023)   PRAPARE - Administrator, Civil Service (Medical): No    Lack of Transportation (Non-Medical): No  Physical Activity: Inactive (06/04/2023)   Exercise Vital Sign    Days of Exercise per Week: 0 days    Minutes of Exercise per Session: 0 min  Stress: No Stress Concern Present (06/04/2023)   [employer] of Occupational Health - Occupational Stress Questionnaire    Feeling of Stress : Not at all  Social Connections:  Moderately Integrated (06/04/2023)   Social Connection and Isolation Panel [NHANES]    Frequency of Communication with Friends and Family: More than three times a week    Frequency of Social Gatherings with Friends and Family: More than three times a week    Attends Religious Services: More than 4 times per year    Active Member of [employer] or Organizations: No    Attends Banker Meetings: Never    Marital Status: Married  Catering manager Violence: Not At Risk (06/04/2023)   Humiliation, Afraid, Rape, and Kick questionnaire  Fear of Current or Ex-Partner: No    Emotionally Abused: No    Physically Abused: No    Sexually Abused: No    Review of Systems: Positive for none All other review of systems negative except as mentioned in the HPI.  Physical Exam: Vital signs in last 24 hours: @VSRANGES @   General:   Alert,  Well-developed, well-nourished, pleasant and cooperative in NAD Lungs:  Clear throughout to auscultation.   Heart:  Regular rate and rhythm; no murmurs, clicks, rubs,  or gallops. Abdomen:  Soft, nontender and nondistended. Normal bowel sounds.   Neuro/Psych:  Alert and cooperative. Normal mood and affect. A and O x 3    No significant changes were identified.  The patient continues to be an appropriate candidate for the planned procedure and anesthesia.   Edman Circle, MD. [facility] 03/05/2024 11:30 AM@

## 2024-03-05 NOTE — Progress Notes (Signed)
 Report to PACU, RN, vss, BBS= Clear.

## 2024-03-05 NOTE — Op Note (Signed)
 [facility] Patient Name: Catherine Stokes Procedure Date: 03/05/2024 10:49 AM MRN: [id number] Endoscopist: Lynann Bologna , MD, [id number] Age: 73 Referring MD:  Date of Birth: 1951/08/11 Gender: Female Account #: [id number] Procedure:                Colonoscopy Indications:              Screening for colorectal malignant neoplasm Medicines:                Monitored Anesthesia Care Procedure:                Pre-Anesthesia Assessment:                           - Prior to the procedure, a History and Physical                            was performed, and patient medications and                            allergies were reviewed. The patient's tolerance of                            previous anesthesia was also reviewed. The risks                            and benefits of the procedure and the sedation                            options and risks were discussed with the patient.                            All questions were answered, and informed consent                            was obtained. Prior Anticoagulants: The patient has                            taken no anticoagulant or antiplatelet agents. ASA                            Grade Assessment: II - A patient with mild systemic                            disease. After reviewing the risks and benefits,                            the patient was deemed in satisfactory condition to                            undergo the procedure.                           After obtaining informed consent, the colonoscope  was passed under direct vision. Throughout the                            procedure, the patient's blood pressure, pulse, and                            oxygen saturations were monitored continuously. The                            Olympus Scope PCF SN: [id number] was introduced                            through the anus and advanced to the 2 cm into the                            ileum. The  colonoscopy was performed without                            difficulty. The patient tolerated the procedure                            well. The quality of the bowel preparation was                            adequate to identify polyps. The terminal ileum,                            ileocecal valve, appendiceal orifice, and rectum                            were photographed. Scope In: 11:07:58 AM Scope Out: 11:24:52 AM Scope Withdrawal Time: 0 hours 7 minutes 54 seconds  Total Procedure Duration: 0 hours 16 minutes 54 seconds  Findings:                 Multiple medium-mouthed diverticula were found in                            the sigmoid colon giving it a "Swiss cheese                            appearance". There was evidence of luminal                            narrowing consistent with muscular hypertrophy.                            Mild SCAD. Also noted were few diverticula in                            descending colon, hepatic flexure and ascending                            colon.  Non-bleeding internal hemorrhoids were found during                            retroflexion. The hemorrhoids were small and Grade                            I (internal hemorrhoids that do not prolapse).                           The terminal ileum appeared normal.                           The exam was otherwise without abnormality on                            direct and retroflexion views. The colon was highly                            redundant. Complications:            No immediate complications. Estimated Blood Loss:     Estimated blood loss: none. Impression:               - Pancolonic diverticulosis predominantly in the                            sigmoid colon.                           - Non-bleeding internal hemorrhoids.                           - The examined portion of the ileum was normal.                           - The examination was otherwise normal  on direct                            and retroflexion views.                           - No specimens collected. Recommendation:           - Patient has a contact number available for                            emergencies. The signs and symptoms of potential                            delayed complications were discussed with the                            patient. Return to normal activities tomorrow.                            Written discharge instructions were provided to the  patient.                           - High fiber diet.                           - Miralax 1 capful (17 grams) in 8 ounces of water                            PO daily.                           - Repeat colonoscopy is not recommended for                            screening purposes. Hence repeat colonoscopy only                            if with any new problems.                           - The findings and recommendations were discussed                            with the patient's family. Lynann Bologna, MD 03/05/2024 11:29:51 AM This report has been signed electronically.

## 2024-03-06 ENCOUNTER — Telehealth: Payer: Self-pay

## 2024-03-06 NOTE — Telephone Encounter (Signed)
  Follow up Call-     03/05/2024   10:41 AM  Call back number  Post procedure Call Back phone  # [phone]  Permission to leave phone message Yes     Patient questions:  Do you have a fever, pain , or abdominal swelling? No. Pain Score  0 *  Have you tolerated food without any problems? Yes.    Have you been able to return to your normal activities? Yes.    Do you have any questions about your discharge instructions: Diet   No. Medications  No. Follow up visit  No.  Do you have questions or concerns about your Care? No.  Actions: * If pain score is 4 or above: No action needed, pain <4.

## 2024-03-16 ENCOUNTER — Other Ambulatory Visit: Payer: Self-pay | Admitting: Physician Assistant

## 2024-03-16 DIAGNOSIS — M1990 Unspecified osteoarthritis, unspecified site: Secondary | ICD-10-CM

## 2024-04-10 ENCOUNTER — Other Ambulatory Visit: Payer: Self-pay | Admitting: Family Medicine

## 2024-04-13 ENCOUNTER — Other Ambulatory Visit: Payer: Self-pay | Admitting: Physician Assistant

## 2024-04-13 DIAGNOSIS — E782 Mixed hyperlipidemia: Secondary | ICD-10-CM

## 2024-05-06 ENCOUNTER — Other Ambulatory Visit

## 2024-05-06 ENCOUNTER — Other Ambulatory Visit: Payer: Self-pay

## 2024-05-06 DIAGNOSIS — E119 Type 2 diabetes mellitus without complications: Secondary | ICD-10-CM

## 2024-05-06 DIAGNOSIS — E782 Mixed hyperlipidemia: Secondary | ICD-10-CM

## 2024-05-07 ENCOUNTER — Encounter: Payer: Self-pay | Admitting: Physician Assistant

## 2024-05-07 ENCOUNTER — Ambulatory Visit: Payer: PPO | Admitting: Physician Assistant

## 2024-05-07 ENCOUNTER — Ambulatory Visit: Payer: Self-pay | Admitting: Physician Assistant

## 2024-05-07 VITALS — BP 106/60 | HR 72 | Temp 97.7°F | Ht 66.0 in | Wt 135.8 lb

## 2024-05-07 DIAGNOSIS — F5102 Adjustment insomnia: Secondary | ICD-10-CM

## 2024-05-07 DIAGNOSIS — E119 Type 2 diabetes mellitus without complications: Secondary | ICD-10-CM | POA: Diagnosis not present

## 2024-05-07 DIAGNOSIS — E782 Mixed hyperlipidemia: Secondary | ICD-10-CM

## 2024-05-07 DIAGNOSIS — N959 Unspecified menopausal and perimenopausal disorder: Secondary | ICD-10-CM | POA: Diagnosis not present

## 2024-05-07 LAB — CBC WITH DIFFERENTIAL/PLATELET
Basophils Absolute: 0.1 10*3/uL (ref 0.0–0.2)
Basos: 2 %
EOS (ABSOLUTE): 0.2 10*3/uL (ref 0.0–0.4)
Eos: 3 %
Hematocrit: 36.3 % (ref 34.0–46.6)
Hemoglobin: 11.8 g/dL (ref 11.1–15.9)
Immature Grans (Abs): 0 10*3/uL (ref 0.0–0.1)
Immature Granulocytes: 0 %
Lymphocytes Absolute: 1.8 10*3/uL (ref 0.7–3.1)
Lymphs: 31 %
MCH: 30.6 pg (ref 26.6–33.0)
MCHC: 32.5 g/dL (ref 31.5–35.7)
MCV: 94 fL (ref 79–97)
Monocytes Absolute: 0.4 10*3/uL (ref 0.1–0.9)
Monocytes: 7 %
Neutrophils Absolute: 3.4 10*3/uL (ref 1.4–7.0)
Neutrophils: 57 %
Platelets: 308 10*3/uL (ref 150–450)
RBC: 3.86 x10E6/uL (ref 3.77–5.28)
RDW: 12.6 % (ref 11.7–15.4)
WBC: 6 10*3/uL (ref 3.4–10.8)

## 2024-05-07 LAB — LIPID PANEL
Chol/HDL Ratio: 2.6 ratio (ref 0.0–4.4)
Cholesterol, Total: 161 mg/dL (ref 100–199)
HDL: 63 mg/dL (ref >39)
LDL Chol Calc (NIH): 82 mg/dL (ref 0–99)
Triglycerides: 84 mg/dL (ref 0–149)
VLDL Cholesterol Cal: 16 mg/dL (ref 5–40)

## 2024-05-07 LAB — CMP14+EGFR
ALT: 20 IU/L (ref 0–32)
AST: 18 IU/L (ref 0–40)
Albumin: 4.1 g/dL (ref 3.8–4.8)
Alkaline Phosphatase: 72 IU/L (ref 44–121)
BUN/Creatinine Ratio: 26 (ref 12–28)
BUN: 20 mg/dL (ref 8–27)
Bilirubin Total: 0.3 mg/dL (ref 0.0–1.2)
CO2: 21 mmol/L (ref 20–29)
Calcium: 9.9 mg/dL (ref 8.7–10.3)
Chloride: 104 mmol/L (ref 96–106)
Creatinine, Ser: 0.76 mg/dL (ref 0.57–1.00)
Globulin, Total: 2.4 g/dL (ref 1.5–4.5)
Glucose: 89 mg/dL (ref 70–99)
Potassium: 5.1 mmol/L (ref 3.5–5.2)
Sodium: 140 mmol/L (ref 134–144)
Total Protein: 6.5 g/dL (ref 6.0–8.5)
eGFR: 83 mL/min/{1.73_m2} (ref >59)

## 2024-05-07 LAB — MICROALBUMIN / CREATININE URINE RATIO
Creatinine, Urine: 45.1 mg/dL
Microalb/Creat Ratio: <7 mg/g{creat} (ref 0–29)
Microalbumin, Urine: <3 ug/mL

## 2024-05-07 LAB — HEMOGLOBIN A1C
Est. average glucose Bld gHb Est-mCnc: 114 mg/dL
Hgb A1c MFr Bld: 5.6 % (ref 4.8–5.6)

## 2024-05-07 NOTE — Progress Notes (Signed)
 Established Patient Office Visit  Subjective:  Patient ID: Catherine Stokes, female    DOB: [birth year]  Age: 73 y.o. MRN: [id number]  CC:  Chief Complaint  Patient presents with   Medical Management of Chronic Issues    HPI Catherine Stokes presents for chronic follow up  Pt states she has had a history of hyperlipidemia for about 3 years.  She is currently on pravachol  40mg  qd.  Due for labwork  Pt with  arthritis - uses meloxicam  15mg  as needed but takes most every day that she works at the [employer] with diabetes - currently on glucophage  500mg  - does not check glucose regularly Is up to date on eye exam - declines foot exam today  Pt with  insomina- takes ambien  and has been on this medication several years  Pt is due for dexa scan - would like to schedule  Past Medical History:  Diagnosis Date   Hyperlipidemia    Prediabetes     Past Surgical History:  Procedure Laterality Date   CHOLECYSTECTOMY  07/24/2012   Procedure: LAPAROSCOPIC CHOLECYSTECTOMY WITH INTRAOPERATIVE CHOLANGIOGRAM;  Surgeon: Azucena Bollard, MD;  Location: [facility];  Service: General;  Laterality: N/A;   COLONOSCOPY     HERNIA REPAIR  11/28/2003   umbilical   TUBAL LIGATION  11/27/1978    Family History  Problem Relation Age of Onset   Diabetes Mother    Heart disease Mother    Breast cancer Neg Hx    Colon cancer Neg Hx    Colon polyps Neg Hx    Esophageal cancer Neg Hx    Rectal cancer Neg Hx    Stomach cancer Neg Hx     Social History   Socioeconomic History   Marital status: Married    Spouse name: Not on file   Number of children: Not on file   Years of education: Not on file   Highest education level: Not on file  Occupational History   Occupation: Farm  Tobacco Use   Smoking status: Never   Smokeless tobacco: Never  Substance and Sexual Activity   Alcohol use: No   Drug use: No   Sexual activity: Not Currently  Other Topics Concern   Not on file  Social  History Narrative   Not on file   Social Drivers of Health   Financial Resource Strain: Low Risk  (06/04/2023)   Overall Financial Resource Strain (CARDIA)    Difficulty of Paying Living Expenses: Not hard at all  Food Insecurity: No Food Insecurity (06/04/2023)   Hunger Vital Sign    Worried About Running Out of Food in the Last Year: Never true    Ran Out of Food in the Last Year: Never true  Transportation Needs: No Transportation Needs (06/04/2023)   PRAPARE - Administrator, Civil Service (Medical): No    Lack of Transportation (Non-Medical): No  Physical Activity: Inactive (06/04/2023)   Exercise Vital Sign    Days of Exercise per Week: 0 days    Minutes of Exercise per Session: 0 min  Stress: No Stress Concern Present (06/04/2023)   [employer] of Occupational Health - Occupational Stress Questionnaire    Feeling of Stress : Not at all  Social Connections: Moderately Integrated (06/04/2023)   Social Connection and Isolation Panel [NHANES]    Frequency of Communication with Friends and Family: More than three times a week    Frequency of Social Gatherings with Friends and  Family: More than three times a week    Attends Religious Services: More than 4 times per year    Active Member of Clubs or Organizations: No    Attends Banker Meetings: Never    Marital Status: Married  Catering manager Violence: Not At Risk (06/04/2023)   Humiliation, Afraid, Rape, and Kick questionnaire    Fear of Current or Ex-Partner: No    Emotionally Abused: No    Physically Abused: No    Sexually Abused: No     Current Outpatient Medications:    Blood Glucose Monitoring Suppl (ONETOUCH VERIO FLEX SYSTEM) w/Device KIT, Inject 1 each as directed daily., Disp: , Rfl:    Blood Glucose Monitoring Suppl DEVI, 1 each by Does not apply route in the morning, at noon, and at bedtime. May substitute to any manufacturer covered by patient's insurance., Disp: 1 each, Rfl: 0   ketoconazole  (NIZORAL) 2 % shampoo, SMARTSIG:Topical 2-3 Times Weekly, Disp: , Rfl:    Lancets (ONETOUCH DELICA PLUS LANCET33G) MISC, USE THREE TIMES DAILY, EVERY MORNING, AT NOON, AND EVERY NIGHT AT BEDTIME, TO TEST BLOOD SUGAR, Disp: 100 each, Rfl: 1   meloxicam  (MOBIC ) 15 MG tablet, TAKE 1 TABLET(15 MG) BY MOUTH DAILY, Disp: 30 tablet, Rfl: 3   metFORMIN  (GLUCOPHAGE ) 500 MG tablet, TAKE 1 TABLET(500 MG) BY MOUTH TWICE DAILY, Disp: 180 tablet, Rfl: 0   ONETOUCH ULTRA test strip, USE THREE TIMES DAILY, EVERY MORNING, AT NOON, AND EVERY NIGHT AT BEDTIME, TO TEST BLOOD SUGAR, Disp: 100 strip, Rfl: 0   pravastatin  (PRAVACHOL ) 40 MG tablet, TAKE 1 TABLET(40 MG) BY MOUTH DAILY, Disp: 90 tablet, Rfl: 0   zolpidem  (AMBIEN ) 10 MG tablet, TAKE 1 TABLET(10 MG) BY MOUTH AT BEDTIME AS NEEDED, Disp: 30 tablet, Rfl: 0   No Known Allergies  CONSTITUTIONAL: Negative for chills, fatigue, fever, unintentional weight gain and unintentional weight loss.  E/N/T: Negative for ear pain, nasal congestion and sore throat.  CARDIOVASCULAR: Negative for chest pain, dizziness, palpitations and pedal edema.  RESPIRATORY: Negative for recent cough and dyspnea.  GASTROINTESTINAL: Negative for abdominal pain, acid reflux symptoms, constipation, diarrhea, nausea and vomiting.  MSK: Negative for arthralgias and myalgias.  INTEGUMENTARY: Negative for rash.  NEUROLOGICAL: Negative for dizziness and headaches.  PSYCHIATRIC: Negative for sleep disturbance and to question depression screen.  Negative for depression, negative for anhedonia.         Objective:  PHYSICAL EXAM:   VS: BP 106/60   Pulse 72   Temp 97.7 F (36.5 C)   Ht 5' 6 (1.676 m)   Wt 135 lb 12.8 oz (61.6 kg)   SpO2 96%   BMI 21.92 kg/m   GEN: Well nourished, well developed, in no acute distress   Cardiac: RRR; no murmurs, rubs, or gallops,no edema -  Respiratory:  normal respiratory rate and pattern with no distress - normal breath sounds with no rales,  rhonchi, wheezes or rubs  MS: no deformity or atrophy  Skin: warm and dry, no rash  Neuro:  Alert and Oriented x 3,  - CN II-Xii grossly intact Psych: euthymic mood, appropriate affect and demeanor  No visits with results within 1 Day(s) from this visit.  Latest known visit with results is:  Office Visit on 02/26/2024  Component Date Value Ref Range Status   Color, UA 02/26/2024 yellow  yellow Final   Clarity, UA 02/26/2024 clear  clear Final   Glucose, UA 02/26/2024 negative  negative mg/dL Final   Bilirubin,  UA 02/26/2024 negative  negative Final   Ketones, POC UA 02/26/2024 negative  negative mg/dL Final   Spec Grav, UA 16/08/9603 1.010  1.010 - 1.025 Final   Blood, UA 02/26/2024 negative  negative Final   pH, UA 02/26/2024 6.0  5.0 - 8.0 Final   POC PROTEIN,UA 02/26/2024 negative  negative, trace Final   Urobilinogen, UA 02/26/2024 0.2  0.2 or 1.0 E.U./dL Final   Nitrite, UA 54/07/8118 Negative  Negative Final   Leukocytes, UA 02/26/2024 Negative  Negative Final   WBC 02/26/2024 8.5  3.4 - 10.8 x10E3/uL Final   RBC 02/26/2024 3.72 (L)  3.77 - 5.28 x10E6/uL Final   Hemoglobin 02/26/2024 11.2  11.1 - 15.9 g/dL Final   Hematocrit 14/78/2956 34.3  34.0 - 46.6 % Final   MCV 02/26/2024 92  79 - 97 fL Final   MCH 02/26/2024 30.1  26.6 - 33.0 pg Final   MCHC 02/26/2024 32.7  31.5 - 35.7 g/dL Final   RDW 21/30/8657 12.4  11.7 - 15.4 % Final   Platelets 02/26/2024 343  150 - 450 x10E3/uL Final   Neutrophils 02/26/2024 67  Not Estab. % Final   Lymphs 02/26/2024 24  Not Estab. % Final   Monocytes 02/26/2024 7  Not Estab. % Final   Eos 02/26/2024 1  Not Estab. % Final   Basos 02/26/2024 1  Not Estab. % Final   Neutrophils Absolute 02/26/2024 5.6  1.4 - 7.0 x10E3/uL Final   Lymphocytes Absolute 02/26/2024 2.0  0.7 - 3.1 x10E3/uL Final   Monocytes Absolute 02/26/2024 0.6  0.1 - 0.9 x10E3/uL Final   EOS (ABSOLUTE) 02/26/2024 0.1  0.0 - 0.4 x10E3/uL Final   Basophils Absolute 02/26/2024  0.1  0.0 - 0.2 x10E3/uL Final   Immature Granulocytes 02/26/2024 0  Not Estab. % Final   Immature Grans (Abs) 02/26/2024 0.0  0.0 - 0.1 x10E3/uL Final   Glucose 02/26/2024 84  70 - 99 mg/dL Final   BUN 84/69/6295 22  8 - 27 mg/dL Final   Creatinine, Ser 02/26/2024 0.73  0.57 - 1.00 mg/dL Final   eGFR 28/41/3244 87  >59 mL/min/1.73 Final   BUN/Creatinine Ratio 02/26/2024 30 (H)  12 - 28 Final   Sodium 02/26/2024 140  134 - 144 mmol/L Final   Potassium 02/26/2024 4.4  3.5 - 5.2 mmol/L Final   Chloride 02/26/2024 107 (H)  96 - 106 mmol/L Final   CO2 02/26/2024 20  20 - 29 mmol/L Final   Calcium 02/26/2024 9.7  8.7 - 10.3 mg/dL Final   Total Protein 11/29/7251 6.0  6.0 - 8.5 g/dL Final   Albumin 66/44/0347 3.7 (L)  3.8 - 4.8 g/dL Final   Globulin, Total 02/26/2024 2.3  1.5 - 4.5 g/dL Final   Bilirubin Total 02/26/2024 <0.2  0.0 - 1.2 mg/dL Final   Alkaline Phosphatase 02/26/2024 71  44 - 121 IU/L Final   AST 02/26/2024 17  0 - 40 IU/L Final   ALT 02/26/2024 14  0 - 32 IU/L Final   Urine Culture, Routine 02/26/2024 Final report   Final   Organism ID, Bacteria 02/26/2024 Comment   Final   Comment: Mixed urogenital flora Less than 10,000 colonies/mL      Health Maintenance Due  Topic Date Due   Diabetic kidney evaluation - Urine ACR  02/04/2023   DEXA SCAN  04/06/2023   HEMOGLOBIN A1C  04/30/2024   Medicare Annual Wellness (AWV)  06/03/2024    There are no preventive care reminders to  display for this patient.  Lab Results  Component Value Date   TSH 1.720 05/01/2023   Lab Results  Component Value Date   WBC 8.5 02/26/2024   HGB 11.2 02/26/2024   HCT 34.3 02/26/2024   MCV 92 02/26/2024   PLT 343 02/26/2024   Lab Results  Component Value Date   NA 140 02/26/2024   K 4.4 02/26/2024   CO2 20 02/26/2024   GLUCOSE 84 02/26/2024   BUN 22 02/26/2024   CREATININE 0.73 02/26/2024   BILITOT <0.2 02/26/2024   ALKPHOS 71 02/26/2024   AST 17 02/26/2024   ALT 14 02/26/2024    PROT 6.0 02/26/2024   ALBUMIN 3.7 (L) 02/26/2024   CALCIUM 9.7 02/26/2024   EGFR 87 02/26/2024   Lab Results  Component Value Date   CHOL 170 10/31/2023   Lab Results  Component Value Date   HDL 63 10/31/2023   Lab Results  Component Value Date   LDLCALC 87 10/31/2023   Lab Results  Component Value Date   TRIG 114 10/31/2023   Lab Results  Component Value Date   CHOLHDL 2.7 10/31/2023   Lab Results  Component Value Date   HGBA1C 6.0 (H) 10/31/2023      Assessment & Plan:   Problem List Items Addressed This Visit               Musculoskeletal and Integument   Arthritis Continue mobic  as needed     Other   Mixed hyperlipidemia (Chronic)  Continue pravastatin    Adjustment insomnia (Chronic) Continue ambien  as needed   Prediabetes Continue glucophage  Recommend low carb/low sugar diet Rx for diabetic supplies         No orders of the defined types were placed in this encounter.   Follow-up: Return in about 6 months (around 11/06/2024) for chronic fasting follow-up.    SARA R Otis Portal, PA-C

## 2024-06-05 ENCOUNTER — Ambulatory Visit

## 2024-06-25 ENCOUNTER — Other Ambulatory Visit: Payer: Self-pay | Admitting: Physician Assistant

## 2024-07-10 ENCOUNTER — Other Ambulatory Visit: Payer: Self-pay | Admitting: Physician Assistant

## 2024-08-19 ENCOUNTER — Other Ambulatory Visit: Payer: Self-pay | Admitting: Physician Assistant

## 2024-08-19 DIAGNOSIS — M1990 Unspecified osteoarthritis, unspecified site: Secondary | ICD-10-CM

## 2024-08-28 ENCOUNTER — Encounter: Payer: Self-pay | Admitting: Family Medicine

## 2024-08-28 ENCOUNTER — Ambulatory Visit (INDEPENDENT_AMBULATORY_CARE_PROVIDER_SITE_OTHER): Admitting: Family Medicine

## 2024-08-28 VITALS — BP 148/72 | HR 72 | Temp 98.0°F | Resp 18 | Ht 66.0 in | Wt 132.6 lb

## 2024-08-28 DIAGNOSIS — R030 Elevated blood-pressure reading, without diagnosis of hypertension: Secondary | ICD-10-CM | POA: Diagnosis not present

## 2024-08-28 DIAGNOSIS — M5431 Sciatica, right side: Secondary | ICD-10-CM

## 2024-08-28 MED ORDER — CYCLOBENZAPRINE HCL 5 MG PO TABS: 5.0000 mg | ORAL_TABLET | Freq: Three times a day (TID) | ORAL | 1 refills | Status: DC | PRN

## 2024-08-28 MED ORDER — KETOROLAC TROMETHAMINE 60 MG/2ML IM SOLN
60.0000 mg | Freq: Once | INTRAMUSCULAR | Status: AC
  Administered 2024-08-28: 60 mg via INTRAMUSCULAR

## 2024-08-28 MED ORDER — TRIAMCINOLONE ACETONIDE 40 MG/ML IJ SUSP
80.0000 mg | Freq: Once | INTRAMUSCULAR | Status: AC
  Administered 2024-08-28: 80 mg via INTRAMUSCULAR

## 2024-08-28 MED ORDER — OXYCODONE-ACETAMINOPHEN 5-325 MG PO TABS: 1.0000 | ORAL_TABLET | ORAL | 0 refills | Status: AC | PRN

## 2024-08-28 NOTE — Progress Notes (Signed)
 Acute Office Visit  Subjective:    Patient ID: Catherine Stokes, female    DOB: February 05, 1951, 73 y.o.   MRN: [id number]  Chief Complaint  Patient presents with   Back Pain    Discussed the use of AI scribe software for clinical note transcription with the patient, who gave verbal consent to proceed.  History of Present Illness   Catherine Stokes is a 73 year old female who presents with severe leg and back pain following being at the hospital with her husband for heart valve surgery.   Lumbar and right lower extremity pain - Severe pain in the back and right leg since Monday after overnight hospital stay for her husband's heart valve surgery - Pain originates in the back and radiates down the right leg - Characterized by sharp pain and burning sensation at the top of the right leg and knee - Pain worsens with standing and walking - Pain limits daily activities, including cooking - No tingling or weakness in the affected leg - Sensation that the right leg might give out when walking - Leaning forward or bending over slightly alleviates pain - Rest and heat application have not provided relief - No recent injuries or activities that could have triggered the pain   Elevated BP without diagnosis of Hypertension - She states that her BP is normally not high and that it most likely related to the pain she is in 7/10  Past Medical History:  Diagnosis Date   Hyperlipidemia    Prediabetes     Past Surgical History:  Procedure Laterality Date   CHOLECYSTECTOMY  07/24/2012   Procedure: LAPAROSCOPIC CHOLECYSTECTOMY WITH INTRAOPERATIVE CHOLANGIOGRAM;  Surgeon: Donnice KATHEE Lunger, MD;  Location: [facility];  Service: General;  Laterality: N/A;   COLONOSCOPY     HERNIA REPAIR  11/28/2003   umbilical   TUBAL LIGATION  11/27/1978    Family History  Problem Relation Age of Onset   Diabetes Mother    Heart disease Mother    Breast cancer Neg Hx    Colon cancer Neg Hx    Colon polyps Neg  Hx    Esophageal cancer Neg Hx    Rectal cancer Neg Hx    Stomach cancer Neg Hx     Social History   Socioeconomic History   Marital status: Married    Spouse name: Not on file   Number of children: Not on file   Years of education: Not on file   Highest education level: Not on file  Occupational History   Occupation: Farm  Tobacco Use   Smoking status: Never   Smokeless tobacco: Never  Substance and Sexual Activity   Alcohol use: No   Drug use: No   Sexual activity: Not Currently  Other Topics Concern   Not on file  Social History Narrative   Not on file   Social Drivers of Health   Financial Resource Strain: Low Risk  (06/04/2023)   Overall Financial Resource Strain (CARDIA)    Difficulty of Paying Living Expenses: Not hard at all  Food Insecurity: No Food Insecurity (06/04/2023)   Hunger Vital Sign    Worried About Running Out of Food in the Last Year: Never true    Ran Out of Food in the Last Year: Never true  Transportation Needs: No Transportation Needs (06/04/2023)   PRAPARE - Administrator, Civil Service (Medical): No    Lack of Transportation (Non-Medical): No  Physical Activity: Inactive (06/04/2023)  Exercise Vital Sign    Days of Exercise per Week: 0 days    Minutes of Exercise per Session: 0 min  Stress: No Stress Concern Present (06/04/2023)   [employer] of Occupational Health - Occupational Stress Questionnaire    Feeling of Stress : Not at all  Social Connections: Moderately Integrated (06/04/2023)   Social Connection and Isolation Panel    Frequency of Communication with Friends and Family: More than three times a week    Frequency of Social Gatherings with Friends and Family: More than three times a week    Attends Religious Services: More than 4 times per year    Active Member of [employer] or Organizations: No    Attends Banker Meetings: Never    Marital Status: Married  Catering manager Violence: Not At Risk (06/04/2023)    Humiliation, Afraid, Rape, and Kick questionnaire    Fear of Current or Ex-Partner: No    Emotionally Abused: No    Physically Abused: No    Sexually Abused: No    Outpatient Medications Prior to Visit  Medication Sig Dispense Refill   Blood Glucose Monitoring Suppl (ONETOUCH VERIO FLEX SYSTEM) w/Device KIT Inject 1 each as directed daily.     Blood Glucose Monitoring Suppl DEVI 1 each by Does not apply route in the morning, at noon, and at bedtime. May substitute to any manufacturer covered by patient's insurance. 1 each 0   ketoconazole (NIZORAL) 2 % shampoo SMARTSIG:Topical 2-3 Times Weekly     Lancets (ONETOUCH DELICA PLUS LANCET33G) MISC USE THREE TIMES DAILY, EVERY MORNING, AT NOON, AND EVERY NIGHT AT BEDTIME, TO TEST BLOOD SUGAR 100 each 1   meloxicam  (MOBIC ) 15 MG tablet TAKE 1 TABLET(15 MG) BY MOUTH DAILY 30 tablet 3   metFORMIN  (GLUCOPHAGE ) 500 MG tablet TAKE 1 TABLET(500 MG) BY MOUTH TWICE DAILY 180 tablet 0   ONETOUCH ULTRA test strip USE THREE TIMES DAILY, EVERY MORNING, AT NOON, AND EVERY NIGHT AT BEDTIME, TO TEST BLOOD SUGAR 100 strip 0   pravastatin  (PRAVACHOL ) 40 MG tablet TAKE 1 TABLET(40 MG) BY MOUTH DAILY 90 tablet 0   zolpidem  (AMBIEN ) 10 MG tablet TAKE 1 TABLET(10 MG) BY MOUTH AT BEDTIME AS NEEDED 30 tablet 0   No facility-administered medications prior to visit.    No Known Allergies  Review of Systems  Constitutional:  Negative for chills, diaphoresis, fatigue and fever.  HENT:  Negative for congestion, ear pain and sinus pain.   Eyes: Negative.   Respiratory:  Negative for cough and shortness of breath.   Cardiovascular:  Negative for chest pain and palpitations.  Gastrointestinal:  Negative for abdominal pain, constipation, diarrhea, nausea and vomiting.  Endocrine: Negative.   Genitourinary:  Negative for dysuria, frequency and urgency.  Musculoskeletal:  Positive for back pain (radiating down right leg). Negative for arthralgias.  Allergic/Immunologic:  Negative.   Neurological:  Negative for dizziness, weakness, light-headedness and headaches.  Psychiatric/Behavioral:  Negative for dysphoric mood. The patient is not nervous/anxious.        Objective:        08/28/2024    3:24 PM 08/28/2024    2:38 PM 05/07/2024    8:21 AM  Vitals with BMI  Height  5' 6 5' 6  Weight  132 lbs 10 oz 135 lbs 13 oz  BMI  21.41 21.93  Systolic 148 150 893  Diastolic 72 74 60  Pulse  72 72    No data found.   Physical Exam Vitals  reviewed.  Constitutional:      General: She is not in acute distress.    Appearance: Normal appearance.  Eyes:     Conjunctiva/sclera: Conjunctivae normal.  Cardiovascular:     Rate and Rhythm: Normal rate and regular rhythm.     Heart sounds: Normal heart sounds. No murmur heard. Pulmonary:     Effort: Pulmonary effort is normal.     Breath sounds: Normal breath sounds. No wheezing.  Abdominal:     Palpations: Abdomen is soft.  Musculoskeletal:     Lumbar back: Tenderness present. Decreased range of motion. Positive right straight leg raise test.  Neurological:     Mental Status: She is alert. Mental status is at baseline.  Psychiatric:        Mood and Affect: Mood normal.        Behavior: Behavior normal.     Health Maintenance Due  Topic Date Due   Zoster Vaccines- Shingrix (1 of 2) Never done   DEXA SCAN  04/06/2023   Medicare Annual Wellness (AWV)  06/03/2024   Influenza Vaccine  06/27/2024    There are no preventive care reminders to display for this patient.   Lab Results  Component Value Date   TSH 1.720 05/01/2023   Lab Results  Component Value Date   WBC 6.0 05/06/2024   HGB 11.8 05/06/2024   HCT 36.3 05/06/2024   MCV 94 05/06/2024   PLT 308 05/06/2024   Lab Results  Component Value Date   NA 140 05/06/2024   K 5.1 05/06/2024   CO2 21 05/06/2024   GLUCOSE 89 05/06/2024   BUN 20 05/06/2024   CREATININE 0.76 05/06/2024   BILITOT 0.3 05/06/2024   ALKPHOS 72 05/06/2024   AST  18 05/06/2024   ALT 20 05/06/2024   PROT 6.5 05/06/2024   ALBUMIN 4.1 05/06/2024   CALCIUM 9.9 05/06/2024   EGFR 83 05/06/2024   Lab Results  Component Value Date   CHOL 161 05/06/2024   Lab Results  Component Value Date   HDL 63 05/06/2024   Lab Results  Component Value Date   LDLCALC 82 05/06/2024   Lab Results  Component Value Date   TRIG 84 05/06/2024   Lab Results  Component Value Date   CHOLHDL 2.6 05/06/2024   Lab Results  Component Value Date   HGBA1C 5.6 05/06/2024        Results for orders placed or performed in visit on 05/06/24  CBC with Differential/Platelet   Collection Time: 05/06/24  8:14 AM  Result Value Ref Range   WBC 6.0 3.4 - 10.8 x10E3/uL   RBC 3.86 3.77 - 5.28 x10E6/uL   Hemoglobin 11.8 11.1 - 15.9 g/dL   Hematocrit 63.6 65.9 - 46.6 %   MCV 94 79 - 97 fL   MCH 30.6 26.6 - 33.0 pg   MCHC 32.5 31.5 - 35.7 g/dL   RDW 87.3 88.2 - 84.5 %   Platelets 308 150 - 450 x10E3/uL   Neutrophils 57 Not Estab. %   Lymphs 31 Not Estab. %   Monocytes 7 Not Estab. %   Eos 3 Not Estab. %   Basos 2 Not Estab. %   Neutrophils Absolute 3.4 1.4 - 7.0 x10E3/uL   Lymphocytes Absolute 1.8 0.7 - 3.1 x10E3/uL   Monocytes Absolute 0.4 0.1 - 0.9 x10E3/uL   EOS (ABSOLUTE) 0.2 0.0 - 0.4 x10E3/uL   Basophils Absolute 0.1 0.0 - 0.2 x10E3/uL   Immature Granulocytes 0 Not Estab. %  Immature Grans (Abs) 0.0 0.0 - 0.1 x10E3/uL  CMP14+EGFR   Collection Time: 05/06/24  8:14 AM  Result Value Ref Range   Glucose 89 70 - 99 mg/dL   BUN 20 8 - 27 mg/dL   Creatinine, Ser 9.23 0.57 - 1.00 mg/dL   eGFR 83 >40 fO/fpw/8.26   BUN/Creatinine Ratio 26 12 - 28   Sodium 140 134 - 144 mmol/L   Potassium 5.1 3.5 - 5.2 mmol/L   Chloride 104 96 - 106 mmol/L   CO2 21 20 - 29 mmol/L   Calcium 9.9 8.7 - 10.3 mg/dL   Total Protein 6.5 6.0 - 8.5 g/dL   Albumin 4.1 3.8 - 4.8 g/dL   Globulin, Total 2.4 1.5 - 4.5 g/dL   Bilirubin Total 0.3 0.0 - 1.2 mg/dL   Alkaline Phosphatase 72 44  - 121 IU/L   AST 18 0 - 40 IU/L   ALT 20 0 - 32 IU/L  Hemoglobin A1c   Collection Time: 05/06/24  8:14 AM  Result Value Ref Range   Hgb A1c MFr Bld 5.6 4.8 - 5.6 %   Est. average glucose Bld gHb Est-mCnc 114 mg/dL  Lipid panel   Collection Time: 05/06/24  8:14 AM  Result Value Ref Range   Cholesterol, Total 161 100 - 199 mg/dL   Triglycerides 84 0 - 149 mg/dL   HDL 63 >60 mg/dL   VLDL Cholesterol Cal 16 5 - 40 mg/dL   LDL Chol Calc (NIH) 82 0 - 99 mg/dL   Chol/HDL Ratio 2.6 0.0 - 4.4 ratio  Microalbumin / creatinine urine ratio   Collection Time: 05/06/24  8:14 AM  Result Value Ref Range   Creatinine, Urine 45.1 Not Estab. mg/dL   Microalbumin, Urine <6.9 Not Estab. ug/mL   Microalb/Creat Ratio <7 0 - 29 mg/g creat     Assessment & Plan:   Assessment & Plan Right sided sciatica Acute right-sided sciatica with low back and right leg pain Severe pain 7/10, sharp and burning, likely nerve compression. No tingling or weakness. Exacerbated by standing/walking, relieved by rest. - Administer steroid injection, monitor blood sugar. - Administer Toradol injection. - Prescribe Percocet, 5-day course, effective when she had shoulder pain. - Prescribe Flexeril to help relax the muscles in the lower back area. - Refer to orthopedic specialist. - Advise ice and heat therapy. - Recommend rest, avoid exacerbating activities.  Orders:   oxyCODONE -acetaminophen  (PERCOCET/ROXICET) 5-325 MG tablet; Take 1 tablet by mouth every 4 (four) hours as needed for up to 5 days for severe pain (pain score 7-10).   triamcinolone  acetonide (KENALOG -40) injection 80 mg   cyclobenzaprine (FLEXERIL) 5 MG tablet; Take 1 tablet (5 mg total) by mouth 3 (three) times daily as needed for muscle spasms.   AMB referral to orthopedics   ketorolac (TORADOL) injection 60 mg  Elevated blood pressure reading in office without diagnosis of hypertension Elevated BP reading in the office most likely due to pain.  BP  Readings from Last 3 Encounters:  08/28/24 (!) 148/72  05/07/24 106/60  03/05/24 (!) 113/54   - Keep BP log at home and FU in 2 weeks with the nurse.       Meds ordered this encounter  Medications   oxyCODONE -acetaminophen  (PERCOCET/ROXICET) 5-325 MG tablet    Sig: Take 1 tablet by mouth every 4 (four) hours as needed for up to 5 days for severe pain (pain score 7-10).    Dispense:  20 tablet    Refill:  0   triamcinolone  acetonide (KENALOG -40) injection 80 mg   cyclobenzaprine (FLEXERIL) 5 MG tablet    Sig: Take 1 tablet (5 mg total) by mouth 3 (three) times daily as needed for muscle spasms.    Dispense:  30 tablet    Refill:  1   ketorolac (TORADOL) injection 60 mg    Orders Placed This Encounter  Procedures   AMB referral to orthopedics     Follow-up: Return if symptoms worsen or fail to improve.  An After Visit Summary was printed and given to the patient.  Harrie Cedar, FNP [facility] [phone]

## 2024-08-28 NOTE — Assessment & Plan Note (Addendum)
 Acute right-sided sciatica with low back and right leg pain Severe pain 7/10, sharp and burning, likely nerve compression. No tingling or weakness. Exacerbated by standing/walking, relieved by rest. - Administer steroid injection, monitor blood sugar. - Administer Toradol injection. - Prescribe Percocet, 5-day course, effective when she had shoulder pain. - Prescribe Flexeril to help relax the muscles in the lower back area. - Refer to orthopedic specialist. - Advise ice and heat therapy. - Recommend rest, avoid exacerbating activities.  Orders:   oxyCODONE -acetaminophen  (PERCOCET/ROXICET) 5-325 MG tablet; Take 1 tablet by mouth every 4 (four) hours as needed for up to 5 days for severe pain (pain score 7-10).   triamcinolone  acetonide (KENALOG -40) injection 80 mg   cyclobenzaprine (FLEXERIL) 5 MG tablet; Take 1 tablet (5 mg total) by mouth 3 (three) times daily as needed for muscle spasms.   AMB referral to orthopedics   ketorolac (TORADOL) injection 60 mg

## 2024-08-28 NOTE — Assessment & Plan Note (Signed)
 Elevated BP reading in the office most likely due to pain.  BP Readings from Last 3 Encounters:  08/28/24 (!) 148/72  05/07/24 106/60  03/05/24 (!) 113/54   - Keep BP log at home and FU in 2 weeks with the nurse.

## 2024-08-29 ENCOUNTER — Telehealth: Payer: Self-pay

## 2024-08-29 NOTE — Telephone Encounter (Signed)
 PA submitted and approved via covermymeds for cyclobenzaprine.

## 2024-09-01 ENCOUNTER — Telehealth: Payer: Self-pay

## 2024-09-01 ENCOUNTER — Ambulatory Visit: Payer: Self-pay | Admitting: Physician Assistant

## 2024-09-01 ENCOUNTER — Ambulatory Visit: Payer: Self-pay

## 2024-09-01 DIAGNOSIS — M5431 Sciatica, right side: Secondary | ICD-10-CM

## 2024-09-01 NOTE — Telephone Encounter (Signed)
 Copied from CRM [id number]. Topic: Clinical - Prescription Issue >> Sep 01, 2024  8:48 AM Rosaria BRAVO wrote: Reason for CRM: Pt called to report that she did not receive the amount that she discussed with Harrie Cedar. Says she only received 3 days worth instead of 5. Pt needs more medication, says symptoms have not yet cleared.   oxyCODONE -acetaminophen  (PERCOCET/ROXICET) 5-325 MG tablet  [facility] [phone] - [place], [place] - [address] AT [facility] [address] [facility] [address] & [address] [place] [address] Phone: [phone] Fax: [phone]

## 2024-09-01 NOTE — Telephone Encounter (Signed)
 Copied from CRM [id number]. Topic: Clinical - Red Word Triage >> Sep 01, 2024  2:16 PM Wess RAMAN wrote: Red Word that prompted transfer to Nurse Triage: Right hip pain and leg pain. Pain level so bad, having difficulty walking. Would like more oxyCODONE -acetaminophen  (PERCOCET/ROXICET) 5-325 MG tablet.  Pharmacy: [facility] [phone] - [place], [place] - [phone] AT [facility] [address] [facility] [address] & [address] [place] [address] Phone: [phone] Fax: [phone] Hours: Not open 24 hours Reason for Disposition  Caller requesting a CONTROLLED substance prescription refill (e.g., narcotics, ADHD medicines)  Answer Assessment - Initial Assessment Questions 1. DRUG NAME: What medicine do you need to have refilled?     oxyCODONE -acetaminophen  (PERCOCET/ROXICET) 5-325 MG tablet [[id number]]  2. REFILLS REMAINING: How many refills are remaining? Notes: The label on the medicine or pill bottle will show how many refills are remaining. If there are no refills remaining, then a renewal may be needed.     0.  3. EXPIRATION DATE: What is the expiration date? Note: The label states when the prescription will expire, and thus can no longer be refilled.)     02/24/25.  4. PRESCRIBER: Who prescribed it? Note: The prescribing doctor or group is responsible for refill approvals..     NP Harrie Cedar.  5. PHARMACY: Have you contacted your pharmacy (drugstore)? Note: Some pharmacies will contact the doctor (or NP/PA).      N/A  6. SYMPTOMS: Do you have any symptoms?     Right hip and leg pain (7-8/10 when walking), better compared to last OV on 08/28/24. Treating with rest, heat/ice, flexeril and percocet. She has an appointment on Wednesday with orthopedic. Patient was given 20 quantity of pills for percocet but it was written to take every 4 hours. In a 24 hour period that would be 6 pills, which would last 3.3 days. Patient is asking for a refill to get her thru til Wednesday  to see the orthopedic specialist.  7. PREGNANCY: Is there any chance that you are pregnant? When was your last menstrual period?     N/A.  Protocols used: Medication Refill and Renewal Call-A-AH

## 2024-09-02 DIAGNOSIS — M5430 Sciatica, unspecified side: Secondary | ICD-10-CM | POA: Diagnosis not present

## 2024-09-02 DIAGNOSIS — M549 Dorsalgia, unspecified: Secondary | ICD-10-CM | POA: Diagnosis not present

## 2024-09-03 DIAGNOSIS — M1611 Unilateral primary osteoarthritis, right hip: Secondary | ICD-10-CM | POA: Diagnosis not present

## 2024-09-03 DIAGNOSIS — M5441 Lumbago with sciatica, right side: Secondary | ICD-10-CM | POA: Diagnosis not present

## 2024-09-03 DIAGNOSIS — M5431 Sciatica, right side: Secondary | ICD-10-CM | POA: Diagnosis not present

## 2024-09-03 DIAGNOSIS — M5440 Lumbago with sciatica, unspecified side: Secondary | ICD-10-CM | POA: Diagnosis not present

## 2024-09-03 DIAGNOSIS — M25551 Pain in right hip: Secondary | ICD-10-CM | POA: Diagnosis not present

## 2024-09-08 DIAGNOSIS — M1611 Unilateral primary osteoarthritis, right hip: Secondary | ICD-10-CM | POA: Diagnosis not present

## 2024-09-09 ENCOUNTER — Other Ambulatory Visit: Payer: Self-pay | Admitting: Physician Assistant

## 2024-09-26 DIAGNOSIS — M1611 Unilateral primary osteoarthritis, right hip: Secondary | ICD-10-CM | POA: Diagnosis not present

## 2024-10-01 DIAGNOSIS — M85852 Other specified disorders of bone density and structure, left thigh: Secondary | ICD-10-CM | POA: Diagnosis not present

## 2024-10-01 DIAGNOSIS — N959 Unspecified menopausal and perimenopausal disorder: Secondary | ICD-10-CM | POA: Diagnosis not present

## 2024-10-01 DIAGNOSIS — M85851 Other specified disorders of bone density and structure, right thigh: Secondary | ICD-10-CM | POA: Diagnosis not present

## 2024-10-07 ENCOUNTER — Other Ambulatory Visit: Payer: Self-pay | Admitting: Physician Assistant

## 2024-10-07 DIAGNOSIS — E782 Mixed hyperlipidemia: Secondary | ICD-10-CM

## 2024-10-13 ENCOUNTER — Encounter: Payer: Self-pay | Admitting: Physician Assistant

## 2024-10-13 ENCOUNTER — Ambulatory Visit: Admitting: Physician Assistant

## 2024-10-13 VITALS — BP 128/62 | HR 77 | Temp 98.0°F | Resp 16 | Ht 66.0 in | Wt 130.8 lb

## 2024-10-13 DIAGNOSIS — M25551 Pain in right hip: Secondary | ICD-10-CM | POA: Diagnosis not present

## 2024-10-13 DIAGNOSIS — E119 Type 2 diabetes mellitus without complications: Secondary | ICD-10-CM | POA: Diagnosis not present

## 2024-10-13 DIAGNOSIS — Z01818 Encounter for other preprocedural examination: Secondary | ICD-10-CM

## 2024-10-13 NOTE — Progress Notes (Addendum)
 Subjective:  Patient ID: Catherine Stokes, female    DOB: 02/12/1951  Age: 73 y.o. MRN: [id number]  Chief Complaint  Patient presents with   Pre-op Exam    Right Hip    HPI Pt in today for preop physical for right hip replacement.  Pt is currently following with Dr Evalene Chancy and has been taking meloxicam  and has received a hip injection which was not helpful.  She has been found to have moderate arthritis of right hip.  Pt cannot recall any history of injury or trauma but has been bothering her for about 2 months.  She voices no concerns or problems today     05/07/2024    8:24 AM 12/04/2023   11:03 AM 10/31/2023    8:09 AM 06/04/2023    1:09 PM 05/01/2023    8:17 AM  Depression screen PHQ 2/9  Decreased Interest 0 0 0 0 0  Down, Depressed, Hopeless 0 0 0 0 0  PHQ - 2 Score 0 0 0 0 0  Altered sleeping  0 0    Tired, decreased energy  0 0    Change in appetite  0 0    Feeling bad or failure about yourself   0 0    Trouble concentrating  0 0    Moving slowly or fidgety/restless  0 0    Suicidal thoughts  0 0    PHQ-9 Score  0  0     Difficult doing work/chores  Not difficult at all Not difficult at all       Data saved with a previous flowsheet row definition        05/01/2023    8:17 AM 06/04/2023    1:09 PM 10/31/2023    8:08 AM 12/04/2023   11:03 AM 05/07/2024    8:24 AM  Fall Risk  Falls in the past year? 0 0 0 0 0  Was there an injury with Fall? 0 0 0 0 0  Fall Risk Category Calculator 0 0 0 0 0  Patient at Risk for Falls Due to No Fall Risks No Fall Risks No Fall Risks No Fall Risks No Fall Risks  Fall risk Follow up Falls evaluation completed Falls evaluation completed Falls evaluation completed Falls evaluation completed      ROS CONSTITUTIONAL: Negative for chills, fatigue, fever,  CARDIOVASCULAR: Negative for chest pain, dizziness, palpitations and pedal edema.  RESPIRATORY: Negative for recent cough and dyspnea.  GASTROINTESTINAL: Negative for abdominal pain,  acid reflux symptoms, constipation, diarrhea, nausea and vomiting.  MSK: see HPI INTEGUMENTARY: Negative for rash.    Current Outpatient Medications:    Blood Glucose Monitoring Suppl (ONETOUCH VERIO FLEX SYSTEM) w/Device KIT, Inject 1 each as directed daily., Disp: , Rfl:    Blood Glucose Monitoring Suppl DEVI, 1 each by Does not apply route in the morning, at noon, and at bedtime. May substitute to any manufacturer covered by patient's insurance., Disp: 1 each, Rfl: 0   ibuprofen (ADVIL) 200 MG tablet, Take 200 mg by mouth every 6 (six) hours as needed., Disp: , Rfl:    ketoconazole (NIZORAL) 2 % shampoo, SMARTSIG:Topical 2-3 Times Weekly, Disp: , Rfl:    Lancets (ONETOUCH DELICA PLUS LANCET33G) MISC, USE THREE TIMES DAILY, EVERY MORNING, AT NOON, AND EVERY NIGHT AT BEDTIME, TO TEST BLOOD SUGAR, Disp: 100 each, Rfl: 1   meloxicam  (MOBIC ) 15 MG tablet, TAKE 1 TABLET(15 MG) BY MOUTH DAILY, Disp: 30 tablet, Rfl: 3   metFORMIN  (  GLUCOPHAGE ) 500 MG tablet, TAKE 1 TABLET(500 MG) BY MOUTH TWICE DAILY, Disp: 180 tablet, Rfl: 0   ONETOUCH ULTRA test strip, USE THREE TIMES DAILY, EVERY MORNING, AT NOON, AND EVERY NIGHT AT BEDTIME, TO TEST BLOOD SUGAR, Disp: 100 strip, Rfl: 0   pravastatin  (PRAVACHOL ) 40 MG tablet, TAKE 1 TABLET(40 MG) BY MOUTH DAILY, Disp: 90 tablet, Rfl: 0   zolpidem  (AMBIEN ) 10 MG tablet, TAKE 1 TABLET(10 MG) BY MOUTH AT BEDTIME AS NEEDED, Disp: 30 tablet, Rfl: 0   cyclobenzaprine (FLEXERIL) 5 MG tablet, Take 1 tablet (5 mg total) by mouth 3 (three) times daily as needed for muscle spasms., Disp: 30 tablet, Rfl: 1  Past Medical History:  Diagnosis Date   Hyperlipidemia    Prediabetes    Objective:  PHYSICAL EXAM:   BP 128/62   Pulse 77   Temp 98 F (36.7 C)   Resp 16   Ht 5' 6 (1.676 m)   Wt 130 lb 12.8 oz (59.3 kg)   SpO2 97%   BMI 21.11 kg/m    GEN: Well nourished, well developed, in no acute distress  HEENT: normal external ears and nose - normal external auditory  canals and TMS - Lips, Teeth and Gums - normal  Oropharynx - normal mucosa, palate, and posterior pharynx Cardiac: RRR; no murmurs, rubs, or gallops,no edema  Respiratory:  normal respiratory rate and pattern with no distress - normal breath sounds with no rales, rhonchi, wheezes or rubs MS: no deformity or atrophy  Skin: warm and dry, no rash   EKG normal Assessment & Plan:    Right hip pain Continue meloxicam  Follow up with ortho as directed Rom exercises given Pre-op examination -     EKG 12-Lead -     CBC with Differential/Platelet -     Comprehensive metabolic panel with GFR -     Hemoglobin A1c -     Protime-INR  Diabetes mellitus without complication (HCC) -     CBC with Differential/Platelet -     Comprehensive metabolic panel with GFR -     Hemoglobin A1c     Follow-up: Return for as scheduled for next chronic visit.  An After Visit Summary was printed and given to the patient.  CAMIE JONELLE NICHOLAUS DEVONNA [facility] [phone]

## 2024-10-14 ENCOUNTER — Ambulatory Visit: Payer: Self-pay | Admitting: Physician Assistant

## 2024-10-14 LAB — COMPREHENSIVE METABOLIC PANEL WITH GFR
ALT: 16 IU/L (ref 0–32)
AST: 14 IU/L (ref 0–40)
Albumin: 3.9 g/dL (ref 3.8–4.8)
Alkaline Phosphatase: 63 IU/L (ref 49–135)
BUN/Creatinine Ratio: 28 (ref 12–28)
BUN: 19 mg/dL (ref 8–27)
Bilirubin Total: <0.2 mg/dL (ref 0.0–1.2)
CO2: 23 mmol/L (ref 20–29)
Calcium: 10.1 mg/dL (ref 8.7–10.3)
Chloride: 104 mmol/L (ref 96–106)
Creatinine, Ser: 0.69 mg/dL (ref 0.57–1.00)
Globulin, Total: 2.2 g/dL (ref 1.5–4.5)
Glucose: 125 mg/dL — ABNORMAL HIGH (ref 70–99)
Potassium: 4.4 mmol/L (ref 3.5–5.2)
Sodium: 140 mmol/L (ref 134–144)
Total Protein: 6.1 g/dL (ref 6.0–8.5)
eGFR: 92 mL/min/1.73 (ref >59)

## 2024-10-14 LAB — HEMOGLOBIN A1C
Est. average glucose Bld gHb Est-mCnc: 114 mg/dL
Hgb A1c MFr Bld: 5.6 % (ref 4.8–5.6)

## 2024-10-14 LAB — CBC WITH DIFFERENTIAL/PLATELET
Basophils Absolute: 0.1 x10E3/uL (ref 0.0–0.2)
Basos: 1 %
EOS (ABSOLUTE): 0.1 x10E3/uL (ref 0.0–0.4)
Eos: 1 %
Hematocrit: 37.4 % (ref 34.0–46.6)
Hemoglobin: 12 g/dL (ref 11.1–15.9)
Immature Grans (Abs): 0 x10E3/uL (ref 0.0–0.1)
Immature Granulocytes: 0 %
Lymphocytes Absolute: 1.9 x10E3/uL (ref 0.7–3.1)
Lymphs: 23 %
MCH: 30.4 pg (ref 26.6–33.0)
MCHC: 32.1 g/dL (ref 31.5–35.7)
MCV: 95 fL (ref 79–97)
Monocytes Absolute: 0.5 x10E3/uL (ref 0.1–0.9)
Monocytes: 6 %
Neutrophils Absolute: 5.7 x10E3/uL (ref 1.4–7.0)
Neutrophils: 69 %
Platelets: 406 x10E3/uL (ref 150–450)
RBC: 3.95 x10E6/uL (ref 3.77–5.28)
RDW: 13 % (ref 11.7–15.4)
WBC: 8.3 x10E3/uL (ref 3.4–10.8)

## 2024-11-06 NOTE — Progress Notes (Signed)
° °  11/06/2024  Patient ID: Catherine Stokes, female   DOB: 10-08-1951, 73 y.o.   MRN: [id number]  Pharmacy Quality Measure Review  This patient is appearing on a report for being at risk of failing the adherence measure for cholesterol (statin) medications this calendar year.   Medication: pravastatin  40mg  Last fill date: 10/09/24 for 30 day supply  Insurance report was not up to date. No action needed at this time.   Lang Sieve, PharmD, BCGP Clinical Pharmacist  [phone]

## 2024-11-10 ENCOUNTER — Ambulatory Visit: Admitting: Physician Assistant

## 2024-11-10 ENCOUNTER — Other Ambulatory Visit: Payer: Self-pay | Admitting: Physician Assistant

## 2024-11-11 ENCOUNTER — Other Ambulatory Visit: Payer: Self-pay | Admitting: Physician Assistant

## 2024-11-21 ENCOUNTER — Ambulatory Visit: Admitting: Physician Assistant

## 2024-12-18 ENCOUNTER — Other Ambulatory Visit: Payer: Self-pay | Admitting: Physician Assistant

## 2024-12-18 DIAGNOSIS — M1990 Unspecified osteoarthritis, unspecified site: Secondary | ICD-10-CM

## 2024-12-30 ENCOUNTER — Other Ambulatory Visit: Payer: Self-pay | Admitting: Physician Assistant

## 2024-12-30 DIAGNOSIS — Z1231 Encounter for screening mammogram for malignant neoplasm of breast: Secondary | ICD-10-CM

## 2025-01-01 ENCOUNTER — Other Ambulatory Visit: Payer: Self-pay | Admitting: Physician Assistant

## 2025-01-01 ENCOUNTER — Ambulatory Visit

## 2025-01-01 DIAGNOSIS — E782 Mixed hyperlipidemia: Secondary | ICD-10-CM

## 2025-01-13 ENCOUNTER — Other Ambulatory Visit

## 2025-01-14 ENCOUNTER — Ambulatory Visit

## 2025-01-15 ENCOUNTER — Ambulatory Visit: Admitting: Physician Assistant

## 2025-01-20 ENCOUNTER — Ambulatory Visit
